# Patient Record
Sex: Female | Born: 2000
Health system: Southern US, Community
[De-identification: ages and names within clinical notes are randomized; demographics above are authoritative.]

## PROBLEM LIST (undated history)

## (undated) DIAGNOSIS — J45909 Unspecified asthma, uncomplicated: Secondary | ICD-10-CM

## (undated) DIAGNOSIS — L509 Urticaria, unspecified: Secondary | ICD-10-CM

## (undated) DIAGNOSIS — R519 Headache, unspecified: Secondary | ICD-10-CM

## (undated) DIAGNOSIS — R51 Headache: Secondary | ICD-10-CM

## (undated) DIAGNOSIS — L309 Dermatitis, unspecified: Secondary | ICD-10-CM

## (undated) HISTORY — DX: Unspecified asthma, uncomplicated: J45.909

## (undated) HISTORY — DX: Dermatitis, unspecified: L30.9

## (undated) HISTORY — DX: Headache, unspecified: R51.9

## (undated) HISTORY — DX: Urticaria, unspecified: L50.9

## (undated) HISTORY — DX: Headache: R51

---
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
## 2019-03-14 ENCOUNTER — Other Ambulatory Visit: Payer: Self-pay | Admitting: Obstetrics and Gynecology

## 2019-03-14 DIAGNOSIS — N6320 Unspecified lump in the left breast, unspecified quadrant: Secondary | ICD-10-CM

## 2019-03-26 ENCOUNTER — Other Ambulatory Visit: Payer: Self-pay

## 2019-03-26 ENCOUNTER — Telehealth: Payer: Self-pay | Admitting: *Deleted

## 2019-03-26 ENCOUNTER — Other Ambulatory Visit: Payer: Self-pay | Admitting: Obstetrics and Gynecology

## 2019-03-26 ENCOUNTER — Ambulatory Visit
Admission: RE | Admit: 2019-03-26 | Discharge: 2019-03-26 | Disposition: A | Discharge status: Home / Self Care | Payer: 59 | Source: Ambulatory Visit | Attending: Obstetrics and Gynecology | Admitting: Obstetrics and Gynecology

## 2019-03-26 DIAGNOSIS — R591 Generalized enlarged lymph nodes: Secondary | ICD-10-CM

## 2019-03-26 DIAGNOSIS — N6320 Unspecified lump in the left breast, unspecified quadrant: Secondary | ICD-10-CM

## 2019-03-26 NOTE — Telephone Encounter (Signed)
Called and spoke with Olivia Wu. She stated she would go tomorrow at some point and have the labs drawn. I informed her she could visit any [facility] in [place] to have the labs drawn. Lab orders placed.

## 2019-03-26 NOTE — Telephone Encounter (Signed)
Mom has a question regarding Olivia Wu's Dupixent injections - every time [name] gets her injection, the next day her lymph nodes are very swollen and the swelling can last for several days.  She is wondering if this is common and should they be concerned?

## 2019-03-26 NOTE — Telephone Encounter (Signed)
Please inform mom that lymph node swelling is very unusual for dupilumab.  Let us make sure were not dealing with some other issue.  Please have her obtain CBC with differential and CMP for lymphadenopathy.Marland Kitchen

## 2019-03-28 LAB — CBC WITH DIFFERENTIAL
Basophils Absolute: 0 10*3/uL (ref 0.0–0.2)
Basos: 1 %
EOS (ABSOLUTE): 0.4 10*3/uL (ref 0.0–0.4)
Eos: 4 %
Hematocrit: 39.9 % (ref 34.0–46.6)
Hemoglobin: 13.8 g/dL (ref 11.1–15.9)
Lymphocytes Absolute: 1.8 10*3/uL (ref 0.7–3.1)
Lymphs: 20 %
MCH: 29.4 pg (ref 26.6–33.0)
MCHC: 34.6 g/dL (ref 31.5–35.7)
MCV: 85 fL (ref 79–97)
Monocytes Absolute: 0.6 10*3/uL (ref 0.1–0.9)
Monocytes: 7 %
Neutrophils Absolute: 6 10*3/uL (ref 1.4–7.0)
Neutrophils: 68 %
RBC: 4.69 x10E6/uL (ref 3.77–5.28)
RDW: 13 % (ref 11.7–15.4)
WBC: 8.8 10*3/uL (ref 3.4–10.8)

## 2019-03-28 LAB — COMPREHENSIVE METABOLIC PANEL
ALT: 11 IU/L (ref 0–32)
AST: 11 IU/L (ref 0–40)
Albumin/Globulin Ratio: 2.4 — ABNORMAL HIGH (ref 1.2–2.2)
Albumin: 4.5 g/dL (ref 3.9–5.0)
Alkaline Phosphatase: 51 IU/L (ref 39–117)
BUN/Creatinine Ratio: 13 (ref 9–23)
BUN: 12 mg/dL (ref 6–20)
Bilirubin Total: 0.2 mg/dL (ref 0.0–1.2)
CO2: 25 mmol/L (ref 20–29)
Calcium: 9.7 mg/dL (ref 8.7–10.2)
Chloride: 103 mmol/L (ref 96–106)
Creatinine, Ser: 0.91 mg/dL (ref 0.57–1.00)
GFR calc Af Amer: 106 mL/min/{1.73_m2} (ref >59)
GFR calc non Af Amer: 92 mL/min/{1.73_m2} (ref >59)
Globulin, Total: 1.9 g/dL (ref 1.5–4.5)
Glucose: 87 mg/dL (ref 65–99)
Potassium: 4.5 mmol/L (ref 3.5–5.2)
Sodium: 137 mmol/L (ref 134–144)
Total Protein: 6.4 g/dL (ref 6.0–8.5)

## 2019-04-02 ENCOUNTER — Other Ambulatory Visit: Payer: Self-pay | Admitting: Obstetrics and Gynecology

## 2019-04-02 ENCOUNTER — Ambulatory Visit
Admission: RE | Admit: 2019-04-02 | Discharge: 2019-04-02 | Disposition: A | Discharge status: Home / Self Care | Payer: 59 | Source: Ambulatory Visit | Attending: Obstetrics and Gynecology | Admitting: Obstetrics and Gynecology

## 2019-04-02 ENCOUNTER — Other Ambulatory Visit: Payer: Self-pay

## 2019-04-02 DIAGNOSIS — R599 Enlarged lymph nodes, unspecified: Secondary | ICD-10-CM

## 2019-04-02 DIAGNOSIS — N6320 Unspecified lump in the left breast, unspecified quadrant: Secondary | ICD-10-CM

## 2019-06-30 ENCOUNTER — Other Ambulatory Visit: Payer: Self-pay

## 2019-06-30 ENCOUNTER — Encounter: Payer: Self-pay | Admitting: Allergy and Immunology

## 2019-06-30 ENCOUNTER — Ambulatory Visit (INDEPENDENT_AMBULATORY_CARE_PROVIDER_SITE_OTHER): Payer: 59 | Admitting: Allergy and Immunology

## 2019-06-30 VITALS — BP 136/96 | HR 99 | Temp 98.3°F | Ht 67.0 in | Wt 150.6 lb

## 2019-06-30 DIAGNOSIS — L500 Allergic urticaria: Secondary | ICD-10-CM | POA: Diagnosis not present

## 2019-06-30 DIAGNOSIS — J3089 Other allergic rhinitis: Secondary | ICD-10-CM | POA: Diagnosis not present

## 2019-06-30 DIAGNOSIS — J4530 Mild persistent asthma, uncomplicated: Secondary | ICD-10-CM | POA: Diagnosis not present

## 2019-06-30 DIAGNOSIS — Z91018 Allergy to other foods: Secondary | ICD-10-CM

## 2019-06-30 DIAGNOSIS — L2089 Other atopic dermatitis: Secondary | ICD-10-CM | POA: Diagnosis not present

## 2019-06-30 DIAGNOSIS — K200 Eosinophilic esophagitis: Secondary | ICD-10-CM

## 2019-06-30 MED ORDER — AUVI-Q 0.3 MG/0.3ML IJ SOAJ: INTRAMUSCULAR | 3 refills | Status: DC

## 2019-06-30 NOTE — Patient Instructions (Addendum)
  1. Continue Dupilumab injections every 2 weeks  2. Continue food elimination diet - dairy, egg, peanut  3.Continue Zyrtec 10 mg - 1 tablet 1 time per day   4. Continue Pepcid 20 mg - 1 tablet 1-2 times per day  5. If needed:   A. Proventil HFA   B. Auvi-Q 0.3   C. mometasone 0.1% ointment applied 1-2 times a day   6. Can use Flonase 1-2 sprays each nostril once a day during upper airway symptoms  7.  Progressive aerobic interval training exercise program  8. Return to clinic in 6 months or earlier if problem  9.  Obtain flu vaccine and Covid vaccine

## 2019-06-30 NOTE — Progress Notes (Signed)
[place] - [place] - [employer] [employer] - [place] - [place]   Follow-up Note  Referring Provider: Delane Ginger, MD Primary Provider: Delane Ginger, MD (Inactive) Date of Office Visit: 06/30/2019  Subjective:   Olivia Wu (DOB: Apr 12, 2001) is a 19 y.o. female who [facility] 06[facility]/2019 in re-evaluation of the following:  HPI: Olivia Wu returns to this clinic in evaluation of atopic dermatitis, asthma, allergic rhinoconjunctivitis, presumed eosinophilic esophagitis, history of allergic urticaria, and history of food allergy directed against egg and dairy and peanut.  Her last visit to this clinic was 26 December 2018.  Overall she is doing relatively well with her skin while using dupilumab.  She has now stretched out her dupilumab to every 3 to 4 weeks and it is still appears to be working pretty well.  She always develops a slight flare of her skin redness after using dupilumab that lasts up to a week.  She still uses a topical steroid about 1 time per week mostly to her hands and her posterior neck.  Her airway is doing quite well and she has not had to use any Flonase.  She did have some issues with dyspnea on exertion and she was given a short acting bronchodilator by her primary care doctor but this did nothing for that issue.  She does not really exercise at this point in time.  She has not been having any obstruction to swallowing and she has had very little issues with reflux while she remains on Pepcid every day.  She avoids dairy and egg and peanuts and all nut consumption at this point in time.  Allergies as of 06/30/2019      Reactions   Augmentin [amoxicillin-pot Clavulanate] Nausea And Vomiting   Bactrim [sulfamethoxazole-trimethoprim] Hives   Cleocin [clindamycin Hcl] Hives   Codeine    Keflex [cephalexin] Hives   Sulfa Antibiotics       Medication List    albuterol 108 (90 Base) MCG/ACT inhaler Commonly known as: Proventil HFA Inhale 2  puffs into the lungs every 4 (four) hours as needed for wheezing or shortness of breath.   Blisovi 24 Fe 1-20 MG-MCG(24) tablet Generic drug: Norethindrone Acetate-Ethinyl Estrad-FE   cetirizine 10 MG tablet Commonly known as: ZYRTEC Take 10 mg by mouth daily. Take 1 by mouth every night at bedtime.   Dupixent 300 MG/2ML prefilled syringe Generic drug: dupilumab Inject 300 mg into the skin every 14 (fourteen) days.   Elidel 1 % cream Generic drug: pimecrolimus apply to affected area twice a day   EPINEPHrine 0.3 mg/0.3 mL Soaj injection Commonly known as: EPI-PEN Use as directed for life threatening allergic reactions   famotidine 20 MG tablet Commonly known as: PEPCID Take 20 mg by mouth 2 (two) times daily.   mometasone 0.1 % ointment Commonly known as: ELOCON apply to affected area once daily   sertraline 50 MG tablet Commonly known as: ZOLOFT TK 1/2 T PO HS   Wellbutrin XL 300 MG 24 hr tablet Generic drug: buPROPion Take 300 mg by mouth daily.       Past Medical History:  Diagnosis Date  . Asthma   . Eczema   . Headache   . Urticaria     History reviewed. No pertinent surgical history.  Review of systems negative except as noted in HPI / PMHx or noted below:  Review of Systems  Constitutional: Negative.   HENT: Negative.   Eyes: Negative.   Respiratory: Negative.  Cardiovascular: Negative.   Gastrointestinal: Negative.   Genitourinary: Negative.   Musculoskeletal: Negative.   Skin: Negative.   Neurological: Negative.   Endo/Heme/Allergies: Negative.   Psychiatric/Behavioral: Negative.      Objective:   Vitals:   06/30/19 1647 06/30/19 1648  BP: (!) 140/100 (!) 136/96  Pulse: 99   Temp: 98.3 F (36.8 C)   SpO2: 99%    Height: 5\' 7"  (170.2 cm)  Weight: 150 lb 9.6 oz (68.3 kg)   Physical Exam Constitutional:      Appearance: She is not diaphoretic.  HENT:     Head: Normocephalic.     Right Ear: Tympanic membrane, ear canal and  external ear normal.     Left Ear: Tympanic membrane, ear canal and external ear normal.     Nose: Nose normal. No mucosal edema or rhinorrhea.     Mouth/Throat:     Pharynx: Uvula midline. No oropharyngeal exudate.  Eyes:     Conjunctiva/sclera: Conjunctivae normal.  Neck:     Thyroid: No thyromegaly.     Trachea: Trachea normal. No tracheal tenderness or tracheal deviation.  Cardiovascular:     Rate and Rhythm: Normal rate and regular rhythm.     Heart sounds: Normal heart sounds, S1 normal and S2 normal. No murmur.  Pulmonary:     Effort: No respiratory distress.     Breath sounds: Normal breath sounds. No stridor. No wheezing or rales.  Lymphadenopathy:     Head:     Right side of head: No tonsillar adenopathy.     Left side of head: No tonsillar adenopathy.     Cervical: No cervical adenopathy.  Skin:    Findings: No erythema or rash.     Nails: There is no clubbing.  Neurological:     Mental Status: She is alert.     Diagnostics:    Spirometry was performed and demonstrated an FEV1 of 3.73 at 103 % of predicted.  Assessment and Plan:   1. Other allergic rhinitis   2. Other atopic dermatitis   3. Allergic urticaria   4. Asthma, well controlled, mild persistent   5. Food allergy   6. Eosinophilic esophagitis     1. Continue Dupilumab injections every 2 weeks  2. Continue food elimination diet - dairy, egg, peanut  3.Continue Zyrtec 10 mg - 1 tablet 1 time per day   4. Continue Pepcid 20 mg - 1 tablet 1-2 times per day  5. If needed:   A. Proventil HFA   B. Auvi-Q 0.3   C. mometasone 0.1% ointment applied 1-2 times a day   6. Can use Flonase 1-2 sprays each nostril once a day during upper airway symptoms  7.  Progressive aerobic interval training exercise program  8. Return to clinic in 6 months or earlier if problem  9.  Obtain flu vaccine and Covid vaccine  Barb appears to be doing quite well at this point in time and I would like for her to  continue to utilize dupilumab which appears to have really helped not just her skin but her airway and her presumed eosinophilic esophagitis.  She will remain on other medications as noted above and continue to avoid dairy and egg and peanut and nuts.  I did recommend that she engage in some type of a aerobic interval training exercise program which would certainly help her dyspnea on exertion when she became cardiovascularly fit.  I will see her back in this clinic in 6 months or  earlier if there is a problem.  Allena Katz, MD Allergy / Immunology [place]

## 2019-07-01 ENCOUNTER — Telehealth: Payer: Self-pay | Admitting: *Deleted

## 2019-07-01 ENCOUNTER — Encounter: Payer: Self-pay | Admitting: Allergy and Immunology

## 2019-07-01 NOTE — Telephone Encounter (Signed)
-----   Message from Titus Mould, CMA sent at 06/30/2019  4:32 PM EST ----- Hi Tammy:-)  We saw Prerana today and she had a few questions about her Dupixent coverage.  She received notification from her insurance that her approval is going to run out this month and that they might not cover it, and that the copay card might not work anymore.  I told her I would message you and let you know.  Her number is [phone] if you need to contact her.  Thank you, Tammy!!! Dahlia Client

## 2019-07-01 NOTE — Telephone Encounter (Signed)
Called and spoke to mom. Advised really dont know about coverage until we do the new approval and pharmacy runs claim to indicate if there is issue. She can reach out to me if she does have issue and I will be doing approval here in the next couple weeks

## 2019-07-15 ENCOUNTER — Ambulatory Visit (INDEPENDENT_AMBULATORY_CARE_PROVIDER_SITE_OTHER): Payer: 59

## 2019-07-15 ENCOUNTER — Ambulatory Visit: Payer: 59 | Admitting: Podiatry

## 2019-07-15 ENCOUNTER — Other Ambulatory Visit: Payer: Self-pay

## 2019-07-15 VITALS — BP 117/84 | HR 76

## 2019-07-15 DIAGNOSIS — M216X1 Other acquired deformities of right foot: Secondary | ICD-10-CM | POA: Diagnosis not present

## 2019-07-15 DIAGNOSIS — Q6672 Congenital pes cavus, left foot: Secondary | ICD-10-CM

## 2019-07-15 DIAGNOSIS — M21621 Bunionette of right foot: Secondary | ICD-10-CM

## 2019-07-15 DIAGNOSIS — Q6671 Congenital pes cavus, right foot: Secondary | ICD-10-CM | POA: Diagnosis not present

## 2019-07-15 DIAGNOSIS — I7300 Raynaud's syndrome without gangrene: Secondary | ICD-10-CM

## 2019-07-15 DIAGNOSIS — M79671 Pain in right foot: Secondary | ICD-10-CM

## 2019-07-15 DIAGNOSIS — M21622 Bunionette of left foot: Secondary | ICD-10-CM

## 2019-07-15 DIAGNOSIS — L84 Corns and callosities: Secondary | ICD-10-CM | POA: Diagnosis not present

## 2019-07-15 DIAGNOSIS — M79672 Pain in left foot: Secondary | ICD-10-CM

## 2019-07-15 DIAGNOSIS — M216X2 Other acquired deformities of left foot: Secondary | ICD-10-CM

## 2019-07-15 NOTE — Progress Notes (Signed)
Orthotic note:  Moderate pes cavus/RF varus, ligamentous laxity R > L.  Plan on CMFO with 5th met offload, hug arch, 103m heel cup/lat flange and 3* RF valgus post.  This will provide ankle stability and hopefully keep her from laying on lateral border.  [id number]

## 2019-07-15 NOTE — Patient Instructions (Signed)
I have ordered a referral to rheumatology. If you do not hear for them about scheduling within the next 1 week, or you have any questions please give [place] a call at [phone].   We are going to check insurance coverage for inserts  Raynaud Phenomenon  Raynaud phenomenon is a condition that affects the blood vessels (arteries) that carry blood to your fingers and toes. The arteries that supply blood to your ears, lips, nipples, or the tip of your nose might also be affected. Raynaud phenomenon causes the arteries to become narrow temporarily (spasm). As a result, the flow of blood to the affected areas is temporarily decreased. This usually occurs in response to cold temperatures or stress. During an attack, the skin in the affected areas turns white, then blue, and finally red. You may also feel tingling or numbness in those areas. Attacks usually last for only a brief period, and then the blood flow to the area returns to normal. In most cases, Raynaud phenomenon does not cause serious health problems. What are the causes? In many cases, the cause of this condition is not known. The condition may occur on its own (primary Raynaud phenomenon) or may be associated with other diseases or factors (secondary Raynaud phenomenon). Possible causes may include:  Diseases or medical conditions that damage the arteries.  Injuries and repetitive actions that hurt the hands or feet.  Being exposed to certain chemicals.  Taking medicines that narrow the arteries.  Other medical conditions, such as lupus, scleroderma, rheumatoid arthritis, thyroid problems, blood disorders, Sjogren syndrome, or atherosclerosis. What increases the risk? The following factors may make you more likely to develop this condition:  Being 49-80 years old.  Being female.  Having a family history of Raynaud phenomenon.  Living in a cold climate.  Smoking. What are the signs or symptoms? Symptoms of this condition  usually occur when you are exposed to cold temperatures or when you have emotional stress. The symptoms may last for a few minutes or up to several hours. They usually affect your fingers but may also affect your toes, nipples, lips, ears, or the tip of your nose. Symptoms may include:  Changes in skin color. The skin in the affected areas will turn pale or white. The skin may then change from white to bluish to red as normal blood flow returns to the area.  Numbness, tingling, or pain in the affected areas. In severe cases, symptoms may include:  Skin sores.  Tissues decaying and dying (gangrene). How is this diagnosed? This condition may be diagnosed based on:  Your symptoms and medical history.  A physical exam. During the exam, you may be asked to put your hands in cold water to check for a reaction to cold temperature.  Tests, such as: ? Blood tests to check for other diseases or conditions. ? A test to check the movement of blood through your arteries and veins (vascular ultrasound). ? A test in which the skin at the base of your fingernail is examined under a microscope (nailfold capillaroscopy). How is this treated? Treatment for this condition often involves making lifestyle changes and taking steps to control your exposure to cold temperatures. For more severe cases, medicine (calcium channel blockers) may be used to improve blood flow. Surgery is sometimes done to block the nerves that control the affected arteries, but this is rare. Follow these instructions at home: Avoiding cold temperatures Take these steps to avoid exposure to cold:  If possible, stay indoors during  cold weather.  When you go outside during cold weather, dress in layers and wear mittens, a hat, a scarf, and warm footwear.  Wear mittens or gloves when handling ice or frozen food.  Use holders for glasses or cans containing cold drinks.  Let warm water run for a while before taking a shower or  bath.  Warm up the car before driving in cold weather. Lifestyle   If possible, avoid stressful and emotional situations. Try to find ways to manage your stress, such as: ? Exercise. ? Yoga. ? Meditation. ? Biofeedback.  Do not use any products that contain nicotine or tobacco, such as cigarettes and e-cigarettes. If you need help quitting, ask your health care provider.  Avoid secondhand smoke.  Limit your use of caffeine. ? Switch to decaffeinated coffee, tea, and soda. ? Avoid chocolate.  Avoid vibrating tools and machinery. General instructions  Protect your hands and feet from injuries, cuts, or bruises.  Avoid wearing tight rings or wristbands.  Wear loose fitting socks and comfortable, roomy shoes.  Take over-the-counter and prescription medicines only as told by your health care provider. Contact a health care provider if:  Your discomfort becomes worse despite lifestyle changes.  You develop sores on your fingers or toes that do not heal.  Your fingers or toes turn black.  You have breaks in the skin on your fingers or toes.  You have a fever.  You have pain or swelling in your joints.  You have a rash.  Your symptoms occur on only one side of your body. Summary  Raynaud phenomenon is a condition that affects the arteries that carry blood to your fingers, toes, ears, lips, nipples, or the tip of your nose.  In many cases, the cause of this condition is not known.  Symptoms of this condition include changes in skin color, and numbness and tingling of the affected area.  Treatment for this condition includes lifestyle changes, reducing exposure to cold temperatures, and using medicines for severe cases of the condition.  Contact your health care provider if your condition worsens despite treatment. This information is not intended to replace advice given to you by your health care provider. Make sure you discuss any questions you have with your health  care provider. Document Revised: 06/15/2017 Document Reviewed: 07/24/2016 Elsevier Patient Education  2020 [employer].

## 2019-07-16 ENCOUNTER — Other Ambulatory Visit: Payer: Self-pay | Admitting: Podiatry

## 2019-07-16 DIAGNOSIS — Q6671 Congenital pes cavus, right foot: Secondary | ICD-10-CM

## 2019-07-16 DIAGNOSIS — Q6672 Congenital pes cavus, left foot: Secondary | ICD-10-CM

## 2019-07-21 ENCOUNTER — Telehealth: Payer: Self-pay | Admitting: *Deleted

## 2019-07-21 DIAGNOSIS — M216X1 Other acquired deformities of right foot: Secondary | ICD-10-CM

## 2019-07-21 DIAGNOSIS — I7300 Raynaud's syndrome without gangrene: Secondary | ICD-10-CM

## 2019-07-21 NOTE — Progress Notes (Signed)
Subjective:   Patient ID: Olivia Wu, female   DOB: 19 y.o.   MRN: [id number]   HPI 19 year old female presents the office today with multiple concerns.  She states that her main concern was that her feet turn purple will sometimes turn white.  She states that when she gets cold she notices the purple discoloration.  She denies any open sores.  She denies any systemic known autoimmune disease but she states that she has multiple joint issues and that she has been told she has a weakened immune system but she has never been formally diagnosed.  She also gets pain to both of her feet pointing to submetatarsal 5 where she gets callus formation.  She previously did see another doctor for this.  She had inserts when she was younger but she never wore them so she is not sure if they were helpful.  She will occasionally get tingling sensation the also aspects of both of her feet pointing along the lateral aspect of the forefoot.  Review of Systems  All other systems reviewed and are negative.  Past Medical History:  Diagnosis Date  . Asthma   . Eczema   . Headache   . Urticaria     No past surgical history on file.   Current Outpatient Medications:  .  albuterol (PROVENTIL HFA) 108 (90 Base) MCG/ACT inhaler, Inhale 2 puffs into the lungs every 4 (four) hours as needed for wheezing or shortness of breath., Disp: 18 g, Rfl: 1 .  AUVI-Q 0.3 MG/0.3ML SOAJ injection, Use as directed for life-threatening allergic reaction., Disp: 1 each, Rfl: 3 .  BLISOVI 24 FE 1-20 MG-MCG(24) tablet, , Disp: , Rfl:  .  buPROPion (WELLBUTRIN XL) 300 MG 24 hr tablet, Take 300 mg by mouth daily., Disp: , Rfl:  .  cetirizine (ZYRTEC) 10 MG tablet, Take 10 mg by mouth daily. Take 1 by mouth every night at bedtime., Disp: , Rfl:  .  cloNIDine (CATAPRES) 0.1 MG tablet, Take 0.1 mg by mouth at bedtime., Disp: , Rfl:  .  dupilumab (DUPIXENT) 300 MG/2ML prefilled syringe, Inject 300 mg into the skin every 14 (fourteen)  days., Disp: 4 mL, Rfl: 11 .  famotidine (PEPCID) 20 MG tablet, Take 20 mg by mouth 2 (two) times daily., Disp: , Rfl:  .  mometasone (ELOCON) 0.1 % ointment, apply to affected area once daily, Disp: 90 g, Rfl: 5 .  sertraline (ZOLOFT) 50 MG tablet, TK 1/2 T PO HS, Disp: , Rfl: 0  Allergies  Allergen Reactions  . Augmentin [Amoxicillin-Pot Clavulanate] Nausea And Vomiting  . Bactrim [Sulfamethoxazole-Trimethoprim] Hives  . Cleocin [Clindamycin Hcl] Hives  . Codeine   . Keflex [Cephalexin] Hives  . Sulfa Antibiotics           Objective:  Physical Exam  General: AAO x3, NAD  Dermatological: There is purple discoloration present although mild to the digits.  There is no open lesions.  No gangrene.  Vascular: Dorsalis Pedis artery and Posterior Tibial artery pedal pulses are 2/4 bilateral with immedate capillary fill time. There is no pain with calf compression, swelling, warmth, erythema.   Neruologic: Sensation intact with Semmes Weinstein monofilament, vibratory sensation intact.  Musculoskeletal: Pronation deformities present bilaterally.  Tailor's bunion deformities present resulting in a hyperkeratotic lesion and pain submetatarsal 5 bilaterally.  Ankle, subtalar joint range of motion intact but any restrictions.  No edema appreciated bilateral lower extremities.  Muscular strength 5/5 in all groups tested bilateral.  Gait: Unassisted,  Nonantalgic.       Assessment:   19 year old female pronation deformity resulting in callus, discomfort submetatarsal 5; likely Raynaud's disease     Plan:  -Treatment options discussed including all alternatives, risks, and complications -Etiology of symptoms were discussed -X-rays were obtained and reviewed with the patient.  No evidence of acute fracture or stress fracture identified today.  Joint space maintained. -In regards to the pronation and general foot pain I do think she will need to work on changing shoes and also an orthotic  would be beneficial.  She was seen today by Liliane Channel for measurement of orthotics and specifically will offload the fifth metatarsal heads.  I do think the numbness is coming partly from when she is walking as this is intermittent and no radiating pain or weakness.  If she gets continued symptoms consider nerve conduction test. -Likely Raynauds causing the color discoloration we discussed today but she also has a vague underlying history of autoimmune issues.  I would have her see rheumatology to rule out any other underlying issue.   Trula Slade DPM

## 2019-07-21 NOTE — Telephone Encounter (Signed)
-----   Message from Vivi Barrack, DPM sent at 07/21/2019  7:14 AM EST ----- Can you please put in for a rheumatology referral? thanks.

## 2019-07-21 NOTE — Telephone Encounter (Signed)
Required form, clinicals, and demographics faxed to [facility].

## 2019-07-22 ENCOUNTER — Telehealth: Payer: Self-pay | Admitting: *Deleted

## 2019-07-22 NOTE — Telephone Encounter (Signed)
Pt's mtr, Amy states referral to Rheumatologist, but they are not aware of blood test performed.

## 2019-07-22 NOTE — Telephone Encounter (Signed)
I spoke with pt's mtr, Amy and informed that Dr. Ardelle Anton had referred pt to rheumatologist for evaluation due to the symptoms and history pt was presenting, Rheumatology would evaluate and perform diagnostics there as necessary. Amy states pt is only giving her bits and pieces of what she remembers. I offered to send copy of pt's office note to pt and have her go over the notes with her. I reviewed release of information and pt's mtr, Amy is listed to be a receiver of information. Mailed copy of 07/15/2019 to pt.

## 2019-08-05 ENCOUNTER — Other Ambulatory Visit: Payer: Self-pay

## 2019-08-05 ENCOUNTER — Ambulatory Visit: Payer: 59 | Admitting: Orthotics

## 2019-08-05 DIAGNOSIS — M216X1 Other acquired deformities of right foot: Secondary | ICD-10-CM

## 2019-08-05 DIAGNOSIS — I7300 Raynaud's syndrome without gangrene: Secondary | ICD-10-CM

## 2019-08-05 DIAGNOSIS — M21622 Bunionette of left foot: Secondary | ICD-10-CM

## 2019-08-05 DIAGNOSIS — L84 Corns and callosities: Secondary | ICD-10-CM

## 2019-12-18 NOTE — Progress Notes (Signed)
Patient came in today to pick up custom made foot orthotics.  The goals were accomplished and the patient reported no dissatisfaction with said orthotics.  Patient was advised of breakin period and how to report any issues. 

## 2019-12-31 ENCOUNTER — Ambulatory Visit: Payer: 59 | Admitting: Allergy and Immunology

## 2020-01-21 ENCOUNTER — Other Ambulatory Visit: Payer: Self-pay

## 2020-01-21 ENCOUNTER — Ambulatory Visit: Payer: 59 | Admitting: Allergy and Immunology

## 2020-01-21 ENCOUNTER — Encounter: Payer: Self-pay | Admitting: Allergy and Immunology

## 2020-01-21 VITALS — BP 110/84 | HR 96 | Resp 20

## 2020-01-21 DIAGNOSIS — J3089 Other allergic rhinitis: Secondary | ICD-10-CM

## 2020-01-21 DIAGNOSIS — Z91018 Allergy to other foods: Secondary | ICD-10-CM | POA: Diagnosis not present

## 2020-01-21 DIAGNOSIS — J4530 Mild persistent asthma, uncomplicated: Secondary | ICD-10-CM | POA: Diagnosis not present

## 2020-01-21 DIAGNOSIS — L2089 Other atopic dermatitis: Secondary | ICD-10-CM

## 2020-01-21 DIAGNOSIS — F411 Generalized anxiety disorder: Secondary | ICD-10-CM

## 2020-01-21 MED ORDER — MOMETASONE FUROATE 0.1 % EX OINT: TOPICAL_OINTMENT | CUTANEOUS | 5 refills | Status: AC

## 2020-01-21 NOTE — Progress Notes (Signed)
[place] - [place] - [place] - [place]   Follow-up Note  Referring Provider: Delane Ginger, MD Primary Provider: Delane Ginger, MD Date of Office Visit: 01/21/2020  Subjective:   Olivia Wu (DOB: [birth year]) is a 19 y.o. female who [facility] in re-evaluation of the following:  HPI: Olivia Wu returns to this clinic in evaluation of multiorgan atopic disease including atopic dermatitis, asthma, allergic rhinoconjunctivitis, presumed eosinophilic esophagitis, and a history of urticaria and a history of food allergy directed against egg and dairy and peanut.  Her last visit to this clinic was 30 June 2019.  She has had very good control of her skin condition while using dupilumab every 2 weeks and also performing very good attention to allergen avoidance measures directed against dairy and egg and peanut and all nut consumption.  Occasionally she will develop a flareup if she inadvertently consumed some egg product.  This flareup is usually confined to her legs.  All the issue involving her face has basically abated.  She only uses topical steroids as spot therapy for flareups.  She has had very little issues with asthma or her nose.  She rarely uses any short acting bronchodilator.  Sometimes she gets short of breath at night usually around the time of her last 7 days of oral contraceptive 30-day pack use that does not respond to the administration of a short acting bronchodilator.  This appears to be a consistent pattern.  This is associated with anxiety around that time of the month.  She has never had her thyroid checked in investigation of possible hyperthyroidism contributing to her anxiety.  She has not had any issues with her stomach and swallowing while continuing on Pepcid.  Allergies as of 01/21/2020      Reactions   Augmentin [amoxicillin-pot Clavulanate] Nausea And Vomiting   Bactrim  [sulfamethoxazole-trimethoprim] Hives   Cleocin [clindamycin Hcl] Hives   Codeine    Keflex [cephalexin] Hives   Sulfa Antibiotics       Medication List      albuterol 108 (90 Base) MCG/ACT inhaler Commonly known as: Proventil HFA Inhale 2 puffs into the lungs every 4 (four) hours as needed for wheezing or shortness of breath.   Auvi-Q 0.3 mg/0.3 mL Soaj injection Generic drug: EPINEPHrine Use as directed for life-threatening allergic reaction.   Blisovi 24 Fe 1-20 MG-MCG(24) tablet Generic drug: Norethindrone Acetate-Ethinyl Estrad-FE   cetirizine 10 MG tablet Commonly known as: ZYRTEC Take 10 mg by mouth daily. Take 1 by mouth every night at bedtime.   cloNIDine 0.1 MG tablet Commonly known as: CATAPRES Take 0.1 mg by mouth at bedtime.   Dupixent 300 MG/2ML prefilled syringe Generic drug: dupilumab Inject 300 mg into the skin every 14 (fourteen) days.   famotidine 20 MG tablet Commonly known as: PEPCID Take 20 mg by mouth 2 (two) times daily.   mometasone 0.1 % ointment Commonly known as: ELOCON Apply sparingly to red, itchy areas once or twice daily.   sertraline 50 MG tablet Commonly known as: ZOLOFT TK 1/2 T PO HS   Wellbutrin XL 300 MG 24 hr tablet Generic drug: buPROPion Take 300 mg by mouth daily.       Past Medical History:  Diagnosis Date  . Asthma   . Eczema   . Headache   . Urticaria     History reviewed. No pertinent surgical history.  Review of systems negative except as noted in HPI /  PMHx or noted below:  Review of Systems  Constitutional: Negative.   HENT: Negative.   Eyes: Negative.   Respiratory: Negative.   Cardiovascular: Negative.   Gastrointestinal: Negative.   Genitourinary: Negative.   Musculoskeletal: Negative.   Skin: Negative.   Neurological: Negative.   Endo/Heme/Allergies: Negative.   Psychiatric/Behavioral: Negative.      Objective:   Vitals:   01/21/20 1517  BP: 110/84  Pulse: 96  Resp: 20  SpO2:  98%          Physical Exam Constitutional:      Appearance: She is not diaphoretic.  HENT:     Head: Normocephalic.     Right Ear: Tympanic membrane, ear canal and external ear normal.     Left Ear: Tympanic membrane, ear canal and external ear normal.     Nose: Nose normal. No mucosal edema or rhinorrhea.     Mouth/Throat:     Pharynx: Uvula midline. No oropharyngeal exudate.  Eyes:     Conjunctiva/sclera: Conjunctivae normal.  Neck:     Thyroid: No thyromegaly.     Trachea: Trachea normal. No tracheal tenderness or tracheal deviation.  Cardiovascular:     Rate and Rhythm: Normal rate and regular rhythm.     Heart sounds: Normal heart sounds, S1 normal and S2 normal. No murmur heard.   Pulmonary:     Effort: No respiratory distress.     Breath sounds: Normal breath sounds. No stridor. No wheezing or rales.  Lymphadenopathy:     Head:     Right side of head: No tonsillar adenopathy.     Left side of head: No tonsillar adenopathy.     Cervical: No cervical adenopathy.  Skin:    Findings: No erythema or rash.     Nails: There is no clubbing.  Neurological:     Mental Status: She is alert.     Diagnostics:    Spirometry was performed and demonstrated an FEV1 of 4.24 at 117 % of predicted.  Assessment and Plan:   1. Other atopic dermatitis   2. Asthma, well controlled, mild persistent   3. Other allergic rhinitis   4. Food allergy   5. Anxiety state     1. Continue Dupilumab injections every 2 weeks  2. Continue food elimination diet - dairy, egg, peanut  3. Continue Zyrtec 10 mg - 1 tablet 1 time per day   4. Continue Pepcid 20 mg - 1 tablet 1 time per day  5. If needed:   A. Proventil HFA   B. Auvi-Q 0.3   C. mometasone 0.1% ointment applied 1-2 times a day   D. Flonase 1-2 sprays each nostril once a day    6.  Check TSH, FT4, TP for anxiety  7.  Progressive aerobic interval training exercise program  8. Return to clinic in 6 months or earlier if  problem  9.  Obtain flu vaccine and Covid vaccine  Olivia Wu is doing very well regarding her multiorgan atopic disease especially her atopic dermatitis on her current plan as noted above.  She is having some issues with anxiety and we will check thyroid function to make sure that this is not a issue tied up with hyperthyroidism.  If she does well I will see her back in this clinic in 6 months or earlier if there is a problem.  Laurette Schimke, MD Allergy / Immunology [facility]

## 2020-01-21 NOTE — Patient Instructions (Addendum)
  1. Continue Dupilumab injections every 2 weeks  2. Continue food elimination diet - dairy, egg, peanut  3. Continue Zyrtec 10 mg - 1 tablet 1 time per day   4. Continue Pepcid 20 mg - 1 tablet 1 time per day  5. If needed:   A. Proventil HFA   B. Auvi-Q 0.3   C. mometasone 0.1% ointment applied 1-2 times a day   D. Flonase 1-2 sprays each nostril once a day    6.  Check TSH, FT4, TP for anxiety  7.  Progressive aerobic interval training exercise program  8.  Return to clinic in 6 months or earlier if problem  9.  Obtain flu vaccine and Covid vaccine

## 2020-01-22 ENCOUNTER — Encounter: Payer: Self-pay | Admitting: Allergy and Immunology

## 2020-01-23 LAB — TSH+FREE T4
Free T4: 1.05 ng/dL (ref 0.82–1.77)
TSH: 1.68 u[IU]/mL (ref 0.450–4.500)

## 2020-01-23 LAB — THYROID PEROXIDASE ANTIBODY: Thyroperoxidase Ab SerPl-aCnc: <8 IU/mL (ref 0–34)

## 2020-01-26 ENCOUNTER — Ambulatory Visit: Payer: 59 | Admitting: Allergy and Immunology

## 2020-03-18 ENCOUNTER — Ambulatory Visit (INDEPENDENT_AMBULATORY_CARE_PROVIDER_SITE_OTHER): Payer: 59

## 2020-03-18 ENCOUNTER — Ambulatory Visit: Payer: 59 | Admitting: Podiatry

## 2020-03-18 ENCOUNTER — Telehealth: Payer: Self-pay | Admitting: Allergy and Immunology

## 2020-03-18 ENCOUNTER — Other Ambulatory Visit: Payer: Self-pay

## 2020-03-18 DIAGNOSIS — IMO0001 Reserved for inherently not codable concepts without codable children: Secondary | ICD-10-CM

## 2020-03-18 DIAGNOSIS — M7751 Other enthesopathy of right foot: Secondary | ICD-10-CM

## 2020-03-18 DIAGNOSIS — S93401A Sprain of unspecified ligament of right ankle, initial encounter: Secondary | ICD-10-CM | POA: Diagnosis not present

## 2020-03-18 DIAGNOSIS — R200 Anesthesia of skin: Secondary | ICD-10-CM | POA: Diagnosis not present

## 2020-03-18 DIAGNOSIS — I7300 Raynaud's syndrome without gangrene: Secondary | ICD-10-CM

## 2020-03-18 DIAGNOSIS — M7750 Other enthesopathy of unspecified foot: Secondary | ICD-10-CM | POA: Diagnosis not present

## 2020-03-18 MED ORDER — METHYLPREDNISOLONE 4 MG PO TBPK: ORAL_TABLET | ORAL | 0 refills | Status: DC

## 2020-03-18 NOTE — Telephone Encounter (Signed)
Amy called in and states that Olivia Wu has Raynaud's Syndrome.  Amy states that Olivia Wu's hands are numb and tingling for the past 4 to 5 days.  When Amy called to make Olivia Wu an appointment with her PCP, they informed Amy that Olivia Wu can no longer be seen due to her age.  Amy wanted to know, since you were an immunologist, if you have any recommendations on a PCP in the [place] and/or [place] area that would be great for [employer] and her auto immune disease.  Please advise.

## 2020-03-22 NOTE — Telephone Encounter (Signed)
Called and informed mom, Amy, of Dr. Kathyrn Lass message.  Also gave her the telephone number for [facility].

## 2020-03-22 NOTE — Telephone Encounter (Signed)
Please inform Olivia Wu that [facility], not [facility], has very good physicians and they seem to be interested in taking care of people.  She can call them up and get an appointment as a new patient with any of the doctors in that practice.

## 2020-03-23 ENCOUNTER — Telehealth: Payer: Self-pay

## 2020-03-23 NOTE — Telephone Encounter (Signed)
It has been sent. Can you please call her to let her know. If she doesn't hear by the end of the week to let me know.  Thanks.

## 2020-03-23 NOTE — Progress Notes (Signed)
Subjective: 19 year old female presents the office today for concerns of right ankle swelling and pain.  She states that she did not injure the foot or ankle.  Denies redness or warmth.  She states that since the onset is much improved but still some occasional discomfort.  No recent treatment.  She also has concerns of numbness to her left hand.  She has been try to get in with her primary care physician.  She also has Raynaud's and they are concerned of underlying autoimmune issues. Denies any systemic complaints such as fevers, chills, nausea, vomiting. No acute changes since last appointment, and no other complaints at this time.   Objective: AAO x3, NAD DP/PT pulses palpable bilaterally, CRT less than 3 seconds There is mild edema to the right foot compared to contra extremity there is no erythema warmth.  Flexor, extensor tendons appear to be intact.  MMT 5/5.  Ankle, subtalar range of motion intact. No pain with calf compression, swelling, warmth, erythema  Assessment: 19 year old female right foot swelling, pain  Plan: -All treatment options discussed with the patient including all alternatives, risks, complications.  -X-rays obtained reviewed.  No evidence of acute fracture.  Compression, elevation.  Symptoms seem to be improving on its own.  Will monitor. -Given concern of underlying autoimmune issue will refer to rheumatology at [facility]. -Referral to hand specialist for numbness in left hand. -Patient encouraged to call the office with any questions, concerns, change in symptoms.   Vivi Barrack DPM

## 2020-03-23 NOTE — Telephone Encounter (Signed)
Pt mother called today and wanted an update on the Referral to Orthopedic and [facility]. Please advise.

## 2020-04-02 ENCOUNTER — Telehealth: Payer: Self-pay | Admitting: Podiatry

## 2020-04-02 NOTE — Telephone Encounter (Signed)
Pt called stating the [facility] Rhe[facility]ology and Orthopedic Dr. never received a referral. Please advise.

## 2020-04-05 NOTE — Telephone Encounter (Signed)
I have refaxed the rheumatology referral to a different number (called and spoke to the actual location and was given their direct fax numbner) and also online request sent for [facility]

## 2020-04-07 ENCOUNTER — Telehealth: Payer: Self-pay | Admitting: Podiatry

## 2020-04-07 ENCOUNTER — Telehealth: Payer: Self-pay

## 2020-04-07 NOTE — Telephone Encounter (Signed)
I spoke with them already. And have updated her mother.

## 2020-04-07 NOTE — Telephone Encounter (Signed)
LVM for Amy Mother on her cell phone [phone]). I spoke with [facility] today and they stated that they had attempted to schedule pt for an appointment on 03/27/20 but was unable to leave a VM. I did let them know that pts mother is the best contact person and they have signed the release for information to the (designated party). Dr. Ardelle Anton had refaxed referral orders the [facility] in [facility] on 04/05/20 and I refaxed them again today. I spoke with admin assistant and she stated that the referrals have to be scanned in first, then they have to process them and once that is completed they normally reach out to the pt.  Amy mother has been updated via VM with this information.

## 2020-04-07 NOTE — Telephone Encounter (Signed)
Dr. Ardelle Anton refaxed the rheumatology referral  on 04/05/20 and also sent an online request for [facility] on 04/05/20 as well.

## 2020-04-07 NOTE — Telephone Encounter (Signed)
Can someone call [facility] in [facility] and [facility] about the referrals? I called [facility] and spoke to someone to clarify their fax number. I didn't actually speak to anyone at [facility] but I have faxed it and did their online request as well. It has been several weeks.

## 2020-04-07 NOTE — Telephone Encounter (Signed)
The patients mother said that none of the referrals that were sent have not called her. [facility] in [facility] said they didn't get theirs. Their fax number is [phone]. She also has not received a call from the Orthopedist.

## 2020-04-29 ENCOUNTER — Ambulatory Visit: Payer: 59 | Admitting: Podiatry

## 2020-06-08 ENCOUNTER — Encounter: Payer: Self-pay | Admitting: Neurology

## 2020-06-08 ENCOUNTER — Ambulatory Visit: Payer: 59 | Admitting: Neurology

## 2020-06-08 VITALS — BP 130/87 | HR 94 | Ht 67.0 in | Wt 195.0 lb

## 2020-06-08 DIAGNOSIS — M542 Cervicalgia: Secondary | ICD-10-CM

## 2020-06-08 DIAGNOSIS — R200 Anesthesia of skin: Secondary | ICD-10-CM

## 2020-06-08 DIAGNOSIS — M5412 Radiculopathy, cervical region: Secondary | ICD-10-CM

## 2020-06-08 NOTE — Patient Instructions (Signed)
I am not quite sure how to explain your numbness in the left pinky finger. Your exam is fairly benign, nevertheless, I think it is reasonable to look at your neck with an MRI to rule out a pinched nerve type cause and we will also proceed with an electrical nerve and muscle test which we will look at nerve compression or signs of nerve damage.  I recommend that you follow-up with your dermatologist for your ongoing rash and eczema flareup. Please also keep your appointment with the rheumatologist coming up.  We will call you with your MRI results and nerve and muscle test results once we have the reports and proceed from there.  Please also check with Jarrett Soho, PA, if you need a recheck on your vitamin B12 level as it was on the lower end of the spectrum.

## 2020-06-08 NOTE — Progress Notes (Signed)
Subjective:    Patient ID: Olivia Wu is a 19 y.o. female.  HPI     Huston FoleySaima Kirstin Kugler, MD, PhD [facility] [address], [address] [address] [place], [place] [id number]  Dear Olivia Wu,   I saw your patient, Olivia Wu, upon your kind request in my neurologic clinic today for initial consultation of her left hand numbness.  The patient is accompanied by her mother today.  As you know, Ms. Olivia Wu is a 19 year old RH female with an underlying medical history of eczema, asthma, headaches, foot pain, reflux, pneumonia, anxiety, depression, ADD, and OCD (by chart review), and borderline obesity, who reports a 2+ month history of numbness affecting her left hand, particularly the left pinky finger and the ulnar aspect of her left hand. She has no significant pain. She is not completely numb, it started fairly abruptly 1 day but has not progressed. She denies any sudden onset of one-sided weakness or numbness or tingling or droopy face or slurring of speech otherwise, no recurrent headaches. She has recently experienced a flareup in her eczema and also had a tattoo reaction on her arms bilaterally. She has seen an allergy specialist and has been on Dupixent. She had some side effects with the medication and has reduced it to once every month. She is also followed by dermatology and was given a steroid cream to use for 2 weeks for her tattoo reaction. The steroid cream helped while she was on it but her rash flared up after she stopped the steroid cream. She reports no significant radiating pain into the left arm but reports a remote history of left elbow injury. She had 1 or 2 days in the recent past with left-sided neck pain that radiated to the left upper shoulder area and left lateral neck area with discomfort, she had some relief with heat pad.  She has not noticed any right-sided symptoms, feels like her coordination in the left hand is not as good. She has not had any significant  weakness. She has a history of problems with her eyes and has trifocal eyeglasses. She had an eye examination within the last year. She denies any sudden onset of one-sided or bilateral vision loss or double vision or blurry vision as such.  I reviewed your office note from 03/30/20. He had blood work through your office including CBC with differential, BMP, iron panel, vitamin B12 and TSH.  I was able to review blood test results from 03/30/2020.  BMP was unremarkable, creatinine 0.93, BUN 9, TIBC normal at 377, TSH normal at 1.16, CBC with differential unremarkable with the exception of MPV at 7.1 which is slightly low, vitamin B12 on the lower end of the spectrum at 199 with a range given of 180 to 914 pg/mL.  She was referred to orthopedics and rheumatology in September by her podiatrist.  She has a pending appointment with rheumatology in a couple of days.   She tries to hydrate well with water. She does not drink caffeine on a daily basis. She does not smoke or drink alcohol.  Her Past Medical History Is Significant For: Past Medical History:  Diagnosis Date  . Asthma   . Eczema   . Headache   . Urticaria     Her Past Surgical History Is Significant For: No past surgical history on file.  Her Family History Is Significant For: Family History  Problem Relation Age of Onset  . Depression Mother  Hx of post-partum depression-Resolved  . Headache Father        Hx of Ha's as a child-Resolved  . Depression Father   . Anxiety disorder Father   . Depression Paternal Grandmother   . ADD / ADHD Cousin        several cousins with ADD/ADHD    Her Social History Is Significant For: Social History   Socioeconomic History  . Marital status: Single    Spouse name: Not on file  . Number of children: Not on file  . Years of education: Not on file  . Highest education level: Not on file  Occupational History  . Not on file  Tobacco Use  . Smoking status: Never Smoker  .  Smokeless tobacco: Never Used  Substance and Sexual Activity  . Alcohol use: No    Alcohol/week: 0.0 standard drinks  . Drug use: No  . Sexual activity: Never    Birth control/protection: Abstinence, Pill  Other Topics Concern  . Not on file  Social History Narrative   Olivia Wu is a 10th grade student who is home schooled and also attends [employer]. She does very well in school. She lives with her parents and sister. She enjoys drawing, school, and art.   Social Determinants of Health   Financial Resource Strain: Not on file  Food Insecurity: Not on file  Transportation Needs: Not on file  Physical Activity: Not on file  Stress: Not on file  Social Connections: Not on file    Her Allergies Are:  Allergies  Allergen Reactions  . Augmentin [Amoxicillin-Pot Clavulanate] Nausea And Vomiting  . Bactrim [Sulfamethoxazole-Trimethoprim] Hives  . Cleocin [Clindamycin Hcl] Hives  . Codeine   . Keflex [Cephalexin] Hives  . Sulfa Antibiotics   :   Her Current Medications Are:  Outpatient Encounter Medications as of 06/08/2020  Medication Sig  . AUVI-Q 0.3 MG/0.3ML SOAJ injection Use as directed for life-threatening allergic reaction.  Marland Kitchen BLISOVI 24 FE 1-20 MG-MCG(24) tablet   . buPROPion (WELLBUTRIN XL) 300 MG 24 hr tablet Take 300 mg by mouth daily.  . cetirizine (ZYRTEC) 10 MG tablet Take 10 mg by mouth daily. Take 1 by mouth every night at bedtime.  . cloNIDine (CATAPRES) 0.1 MG tablet Take 0.1 mg by mouth at bedtime.  . dupilumab (DUPIXENT) 300 MG/2ML prefilled syringe Inject 300 mg into the skin every 14 (fourteen) days.  . famotidine (PEPCID) 20 MG tablet Take 20 mg by mouth 2 (two) times daily.  . mometasone (ELOCON) 0.1 % ointment Apply sparingly to red, itchy areas once or twice daily.  . sertraline (ZOLOFT) 100 MG tablet Take 200 mg by mouth daily.  . [DISCONTINUED] albuterol (PROVENTIL HFA) 108 (90 Base) MCG/ACT inhaler Inhale 2 puffs into the lungs every 4 (four) hours as needed  for wheezing or shortness of breath.  . [DISCONTINUED] methylPREDNISolone (MEDROL DOSEPAK) 4 MG TBPK tablet Take as directed   No facility-administered encounter medications on file as of 06/08/2020.  :   Review of Systems:  Out of a complete 14 point review of systems, all are reviewed and negative with the exception of these symptoms as listed below: Review of Systems  Neurological:       Pt presents today to discuss her left hand numbness. Half of pt's left hand has been numb since October.    Objective:  Neurological Exam  Physical Exam Physical Examination:   Vitals:   06/08/20 1422  BP: 130/87  Pulse: 94    General  Examination: The patient is a very pleasant 19 y.o. female in no acute distress. She appears well-developed and well-nourished and well groomed. She is mildly anxious appearing.  HEENT: Normocephalic, atraumatic, pupils are equal, round and reactive to light and accommodation. Funduscopic exam is normal with sharp disc margins noted. Extraocular tracking is good without limitation to gaze excursion or nystagmus noted. Normal smooth pursuit is noted. Hearing is grossly intact. Face is symmetric with normal facial animation and normal facial sensation. Speech is clear with no dysarthria noted. There is no hypophonia. There is no lip, neck/head, jaw or voice tremor. Neck is supple with full range of passive and active motion. There are no carotid bruits on auscultation. Oropharynx exam reveals: mild mouth dryness, good dental hygiene. Tongue protrudes centrally and palate elevates symmetrically.   Chest: Clear to auscultation without wheezing, rhonchi or crackles noted.  Heart: S1+S2+0, regular and normal without murmurs, rubs or gallops noted.   Abdomen: Soft, non-tender and non-distended with normal bowel sounds appreciated on auscultation.  Extremities: There is no pitting edema in the distal lower extremities bilaterally. Pedal pulses are intact.  Skin: Warm  and dry with patchy eczematous changes throughout her arms, also rash right around her tattoos and no forearms bilaterally.   Musculoskeletal: exam reveals no obvious joint deformities, tenderness or joint swelling or erythema.   Neurologically:  Mental status: The patient is awake, alert and oriented in all 4 spheres. Her immediate and remote memory, attention, language skills and fund of knowledge are appropriate. There is no evidence of aphasia, agnosia, apraxia or anomia. Speech is clear with normal prosody and enunciation. Thought process is linear. Mood is normal and affect is normal.  Cranial nerves II - XII are as described above under HEENT exam. In addition: shoulder shrug is normal with equal shoulder height noted. Motor exam: Normal bulk, strength and tone is noted. No focal atrophy noted in the left hand.  There is no drift, resting tremor or rebound. She has a slight bilateral upper extremity postural tremor, no significant action tremor or intention tremor. She does admit to feeling somewhat anxious.  Romberg is negative. Reflexes are 2+ throughout. Babinski: Toes are flexor bilaterally. Fine motor skills and coordination: intact with normal finger taps, normal hand movements, normal rapid alternating patting, normal foot taps and normal foot agility.  Cerebellar testing: No dysmetria or intention tremor on finger to nose testing. Heel to shin is unremarkable bilaterally. There is no truncal or gait ataxia.  Sensory exam: intact to light touch, pinprick, vibration, temperature sense in the upper and lower extremities, with the exception of mild decrease in pinprick sensation in the left distal fifth digit, perhaps mild decrease in vibration sense in the left distal digit 5.  Gait, station and balance: She stands easily. No veering to one side is noted. No leaning to one side is noted. Posture is age-appropriate and stance is narrow based. Gait shows normal stride length and normal pace.  No problems turning are noted. Tandem walk is unremarkable.      Assessment and Plan:   In summary, BRIGID VANDEKAMP is a very pleasant 19 y.o.-year old female with an underlying medical history of eczema, asthma, headaches, foot pain, reflux, pneumonia, anxiety, depression, ADD, and OCD (by chart review), and borderline obesity, who presents for evaluation of her numbness of approximately 2 months duration affecting her left ulnar aspect of the hand and pinky finger. Examination is fairly benign with the exception of slight decrease in pinprick sensation  in the distal left digit 5 but overall no focal atrophy, no strength difference, no change in reflex. She has no coordination issues. She does report an episode of left lateral neck pain recently radiating to the left upper shoulder area. She does not have telltale radicular pain and in fact does not have any pain in her left hand.  She does report a remote injury to the left elbow. Differential diagnosis includes compression neuropathy, radiculopathy, unlikely myelopathy.  She had recent blood work through your office, she is advised to follow-up with your office regarding her low normal vitamin B12 level. We will proceed with additional testing through our office in the form of EMG nerve conduction velocity testing of her left upper extremity and MRI of the neck with and without contrast to look for structural changes that could explain her symptoms. She has an appointment with rheumatology pending. I did not suggest any additional blood work from my end of things. We will keep them posted as to her test results by phone call and take it from there. She is also advised to follow-up with her dermatologist regarding her rash and flareup in the eczema.  She also sees an allergy specialist as I understand.  I answered all their questions today and the patient and her mother were in agreement with the plan.  Thank you very much for allowing me to participate in  the care of this nice patient. If I can be of any further assistance to you please do not hesitate to call me at [phone].  Sincerely,   Huston Foley, MD, PhD

## 2020-06-09 ENCOUNTER — Telehealth: Payer: Self-pay | Admitting: Neurology

## 2020-06-09 NOTE — Telephone Encounter (Signed)
[employer] pending. Faxed notes.

## 2020-06-10 NOTE — Telephone Encounter (Signed)
unable to get a hold of the patient her number would not let me leave a vmail  [employer] auth: [id number] (exp. 06/09/20 to 07/24/20)

## 2020-06-14 NOTE — Telephone Encounter (Signed)
no to the covid quesions MR Cervical spine w/wo contrast Dr. Frances Furbish [facility] Auth: [id number] (exp. 06/09/20 to 07/24/20). Patient is scheduled at [facility] for 06/23/20.

## 2020-06-23 ENCOUNTER — Other Ambulatory Visit: Payer: 59

## 2020-06-28 ENCOUNTER — Ambulatory Visit: Payer: 59 | Admitting: Neurology

## 2020-06-28 ENCOUNTER — Ambulatory Visit (INDEPENDENT_AMBULATORY_CARE_PROVIDER_SITE_OTHER): Payer: 59 | Admitting: Neurology

## 2020-06-28 ENCOUNTER — Encounter: Payer: Self-pay | Admitting: Neurology

## 2020-06-28 ENCOUNTER — Telehealth: Payer: Self-pay

## 2020-06-28 ENCOUNTER — Other Ambulatory Visit: Payer: Self-pay

## 2020-06-28 DIAGNOSIS — M542 Cervicalgia: Secondary | ICD-10-CM

## 2020-06-28 DIAGNOSIS — G5622 Lesion of ulnar nerve, left upper limb: Secondary | ICD-10-CM | POA: Insufficient documentation

## 2020-06-28 DIAGNOSIS — M5412 Radiculopathy, cervical region: Secondary | ICD-10-CM

## 2020-06-28 DIAGNOSIS — R200 Anesthesia of skin: Secondary | ICD-10-CM

## 2020-06-28 HISTORY — DX: Lesion of ulnar nerve, left upper limb: G56.22

## 2020-06-28 NOTE — Telephone Encounter (Signed)
I called pt to discuss her EMG/NCV results. No answer, left a message asking her to call me back. 

## 2020-06-28 NOTE — Progress Notes (Signed)
Please refer to EMG and nerve conduction procedure note.  

## 2020-06-28 NOTE — Progress Notes (Addendum)
Please call patient regarding her EMG nerve conduction velocity test results, it shows on the left side that she had an irritation or compression of the left ulnar nerve which would explain her numbness in the hand.  It looks like from the EMG or muscle test result that she may be on the mend or healing.  If she has ongoing issues, we can consult with a orthopedic surgeon for consideration of possible surgical decompression at the elbow.  If she continues to improve, we can also continue to have her monitor her symptoms.  Otherwise, there was no evidence of any widespread nerve or muscle disease.  No evidence of pinched nerve from the neck.      MNC    Nerve / Sites Muscle Latency Ref. Amplitude Ref. Rel Amp Segments Distance Velocity Ref. Area    ms ms mV mV %  cm m/s m/s mVms  L Median - APB     Wrist APB 3.6 ?4.4 12.7 ?4.0 100 Wrist - APB 7   51.7     Upper arm APB 7.5  12.5  98.4 Upper arm - Wrist 22 57 ?49 52.0  L Ulnar - ADM     Wrist ADM 3.0 ?3.3 7.1 ?6.0 100 Wrist - ADM 7   25.9     B.Elbow ADM 7.1  5.8  82.2 B.Elbow - Wrist 21 51 ?49 22.4     A.Elbow ADM 10.0  3.4  58.4 A.Elbow - B.Elbow 10 34 ?49 16.3         A.Elbow - Wrist      R Ulnar - ADM     Wrist ADM 2.6 ?3.3 13.3 ?6.0 100 Wrist - ADM 7   41.5     B.Elbow ADM 6.3  11.8  88.5 B.Elbow - Wrist 22 59 ?49 38.3     A.Elbow ADM 8.1  11.3  95.4 A.Elbow - B.Elbow 10 57 ?49 45.0         A.Elbow - Wrist               SNC    Nerve / Sites Rec. Site Peak Lat Ref.  Amp Ref. Segments Distance    ms ms V V  cm  L Median - Orthodromic (Dig II, Mid palm)     Dig II Wrist 3.2 ?3.4 25 ?10 Dig II - Wrist 13  L Ulnar - Orthodromic, (Dig V, Mid palm)     Dig V Wrist 3.5 ?3.1 3 ?5 Dig V - Wrist 11  R Ulnar - Orthodromic, (Dig V, Mid palm)     Dig V Wrist 3.0 ?3.1 5 ?5 Dig V - Wrist 52           F  Wave    Nerve F Lat Ref.   ms ms  L Ulnar - ADM 32.3 ?32.0

## 2020-06-28 NOTE — Telephone Encounter (Signed)
-----   Message from Huston Foley, MD sent at 06/28/2020  2:23 PM EST ----- Please call patient regarding her EMG nerve conduction velocity test results, it shows on the left side that she had an irritation or compression of the left ulnar nerve which would explain her numbness in the hand.  It looks like from the EMG or muscle test result that she may be on the mend or healing.  If she has ongoing issues, we can consult with a orthopedic surgeon for consideration of possible surgical decompression at the elbow.  If she continues to improve, we can also continue to have her monitor her symptoms.  Otherwise, there was no evidence of any widespread nerve or muscle disease.  No evidence of pinched nerve from the neck.

## 2020-06-28 NOTE — Procedures (Signed)
     HISTORY:  Olivia Wu is a 20 year old patient with a history of onset of numbness in the fourth and fifth fingers of the left hand that began in October 2021.  She had no associated injury to the elbow or forearm.  The patient denies any neck pain or pain down the arm.  She is being evaluated for the left hand issues.  She believes that she is weaker with the left hand.  NERVE CONDUCTION STUDIES:  Nerve conduction studies were performed on the upper extremities bilaterally.  The distal motor latencies and motor amplitudes for the left median nerve and for the ulnar nerves bilaterally were normal.  For the left ulnar nerve there was slowing across the elbow, normal for the right ulnar nerve and normal for the left median nerve.  The sensory latency for the left ulnar nerve is prolonged, but is normal for the right ulnar nerve and for the left median nerve.  The F-wave latency for the left ulnar nerve is prolonged.  EMG STUDIES:  The first dorsal interosseous muscle reveals 2 to 4 K units with slightly reduced recruitment. 2+ positive waves were noted. The abductor pollicis brevis muscle reveals 2 to 4 K units with full recruitment. No fibrillations or positive waves were noted. The extensor indicis proprius muscle reveals 1 to 3 K units with full recruitment. No fibrillations or positive waves were noted. The flexor digitorum profundus muscle (III-IV) reveals 1 to 3 K units with full recruitment.  No fibrillations or positive waves were noted. The pronator teres muscle reveals 2 to 3 K units with full recruitment. No fibrillations or positive waves were noted. The biceps muscle reveals 1 to 2 K units with full recruitment. No fibrillations or positive waves were noted. The triceps muscle reveals 2 to 4 K units with full recruitment. No fibrillations or positive waves were noted. The anterior deltoid muscle reveals 2 to 3 K units with full recruitment. No fibrillations or positive waves were  noted. The cervical paraspinal muscles were tested at 2 levels. No abnormalities of insertional activity were seen at either level tested. There was poor relaxation.   IMPRESSION:  Nerve conduction studies done on both upper extremities shows evidence of slowing across the left elbow for the ulnar nerve consistent with a tardy ulnar palsy.  EMG evaluation of the left upper extremity confirms acute denervation of the first dorsal interosseous muscle consistent with an ulnar neuropathy, with sparing of the flexor digitorum profundus muscle, suggesting the possibility of an early healing process.  No evidence of an overlying cervical radiculopathy was seen.  Marlan Palau MD 06/28/2020 1:58 PM  [facility] [address] [place], [place] [address]  Phone [phone] Fax [phone]

## 2020-06-29 NOTE — Telephone Encounter (Signed)
Pt has called to inform she tried explaining the results of the NCV/EMG to her mother but she still has questions.  Pt is asking Loyola Mast calls her mother(on DPR) to further discuss results.  Mother can be reached at [phone]

## 2020-06-29 NOTE — Telephone Encounter (Signed)
I called pt's mother, Olivia Wu, per DPR. I discussed pt's EMG/NCV results and recommendations. Pt's mother verbalized understanding of results and no further questions.

## 2020-06-29 NOTE — Telephone Encounter (Signed)
I called pt. I discussed her NCV/EMG results and recommendations with her. Pt would like to proceed with cervical MRI as well. Pt verbalized understanding of results. Pt had no questions at this time but was encouraged to call back if questions arise.

## 2020-07-01 NOTE — Telephone Encounter (Signed)
Patient wanted to cancel and did not want to r/s at this time.

## 2020-07-07 ENCOUNTER — Other Ambulatory Visit: Payer: 59

## 2020-07-09 ENCOUNTER — Other Ambulatory Visit: Payer: Self-pay | Admitting: Podiatry

## 2020-07-09 DIAGNOSIS — M216X9 Other acquired deformities of unspecified foot: Secondary | ICD-10-CM

## 2020-07-09 DIAGNOSIS — M7750 Other enthesopathy of unspecified foot: Secondary | ICD-10-CM

## 2020-07-14 ENCOUNTER — Other Ambulatory Visit: Payer: 59

## 2020-11-17 ENCOUNTER — Telehealth: Payer: Self-pay | Admitting: Allergy and Immunology

## 2020-11-17 NOTE — Telephone Encounter (Signed)
Please inform mom that there should be no interaction between dupilumab use and receiving the vaccination.  2 days is an adequate amount of time between those 2 administrations.

## 2020-11-17 NOTE — Telephone Encounter (Signed)
Please advise 

## 2020-11-17 NOTE — Telephone Encounter (Signed)
Mother informed.

## 2020-11-17 NOTE — Telephone Encounter (Signed)
Olivia Wu is on Dupixent and is going tomorrow to get her tetnus shot. Mom called and wanted to know the time between injections. I told her 2 days. She also wanted to know if there might be any interactions with her being on a biologic.

## 2020-12-27 ENCOUNTER — Other Ambulatory Visit: Payer: Self-pay | Admitting: Allergy and Immunology

## 2021-01-12 ENCOUNTER — Other Ambulatory Visit: Payer: Self-pay

## 2021-01-12 ENCOUNTER — Encounter: Payer: Self-pay | Admitting: Allergy and Immunology

## 2021-01-12 ENCOUNTER — Ambulatory Visit: Payer: 59 | Admitting: Allergy and Immunology

## 2021-01-12 VITALS — BP 110/76 | HR 88 | Resp 16 | Ht 67.0 in | Wt 205.0 lb

## 2021-01-12 DIAGNOSIS — J4530 Mild persistent asthma, uncomplicated: Secondary | ICD-10-CM | POA: Diagnosis not present

## 2021-01-12 DIAGNOSIS — J3089 Other allergic rhinitis: Secondary | ICD-10-CM

## 2021-01-12 DIAGNOSIS — L2089 Other atopic dermatitis: Secondary | ICD-10-CM

## 2021-01-12 DIAGNOSIS — T781XXD Other adverse food reactions, not elsewhere classified, subsequent encounter: Secondary | ICD-10-CM

## 2021-01-12 MED ORDER — AUVI-Q 0.3 MG/0.3ML IJ SOAJ: INTRAMUSCULAR | 3 refills | Status: DC

## 2021-01-12 NOTE — Patient Instructions (Addendum)
  1. Continue Dupilumab injections every 2-4 weeks  2. Continue food elimination diet - dairy, egg, peanut  3. Continue Zyrtec 10 mg - 1 tablet 1-2 times per day   4. Continue Pepcid 20 mg - 1 tablet 1-2 times per day  5. If needed:   A. Proventil HFA   B. Auvi-Q 0.3   C. mometasone 0.1% ointment applied 1-2 times a day   D. Flonase 1-2 sprays each nostril once a day    6.  Blood - Milk panel, egg panel, nut panel w/R  7.  Progressive aerobic interval training exercise program not involving shins  8.  Return to clinic in 6 months or earlier if problem

## 2021-01-12 NOTE — Progress Notes (Signed)
Olivia Wu - [place] [facility] [place] [facility] [place] -    Follow-up Note  Referring Provider: Jarrett Soho, PA-C Primary Provider: Jarrett Soho, PA-C Date of Office Visit: 01/12/2021  Subjective:   Olivia Wu (DOB: May 29, 2001) is a 20 y.o. female who [facility] in re-evaluation of the following:  HPI: Olivia Wu returns to this clinic in evaluation of multiorgan atopic disease including atopic dermatitis, asthma, allergic rhinoconjunctivitis, presumed eosinophilic esophagitis, history of urticaria, and a history of food allergy directed against dairy and egg and peanut.  Olivia Wu last visit to this clinic was 21 January 2020.  Olivia Wu eczema intermittently flares up even while using dupilumab and Olivia Wu must use topical mometasone occasionally.  Usually Olivia Wu flareups are associated with inadvertent administration of either egg or dairy.  But overall Olivia Wu has done okay with Olivia Wu skin and has not required a systemic steroid to treat a flareup.  Olivia Wu airway has been under excellent control at this point in time and Olivia Wu rarely uses a short acting bronchodilator.  Olivia Wu has a problem exercising because Olivia Wu has shinsplints.  Likewise, Olivia Wu has had very little problems with Olivia Wu nose.  Olivia Wu reflux appears to be under very good control while using Pepcid.  Allergies as of 01/12/2021       Reactions   Augmentin [amoxicillin-pot Clavulanate] Nausea And Vomiting   Bactrim [sulfamethoxazole-trimethoprim] Hives   Cleocin [clindamycin Hcl] Hives   Codeine    Keflex [cephalexin] Hives   Sulfa Antibiotics         Medication List    Auvi-Q 0.3 mg/0.3 mL Soaj injection Generic drug: EPINEPHrine Use as directed for life-threatening allergic reaction.   Blisovi 24 Fe 1-20 MG-MCG(24) tablet Generic drug: Norethindrone Acetate-Ethinyl Estrad-FE   buPROPion 300 MG 24 hr tablet Commonly known as: WELLBUTRIN XL Take 300 mg by mouth daily.   cetirizine 10 MG  tablet Commonly known as: ZYRTEC Take 10 mg by mouth daily. Take 1 by mouth every night at bedtime.   cloNIDine 0.1 MG tablet Commonly known as: CATAPRES Take 0.1 mg by mouth at bedtime.   Dupixent 300 MG/2ML prefilled syringe Generic drug: dupilumab INJECT 300MG  SUBCUTANEOUSLY EVERY OTHER WEEK   famotidine 20 MG tablet Commonly known as: PEPCID Take 20 mg by mouth 2 (two) times daily.   mometasone 0.1 % ointment Commonly known as: ELOCON Apply sparingly to red, itchy areas once or twice daily.   sertraline 100 MG tablet Commonly known as: ZOLOFT Take 200 mg by mouth daily.        Past Medical History:  Diagnosis Date   Asthma    Eczema    Headache    Ulnar neuropathy at elbow, left 06/28/2020   Urticaria     History reviewed. No pertinent surgical history.  Review of systems negative except as noted in HPI / PMHx or noted below:  Review of Systems  Constitutional: Negative.   HENT: Negative.    Eyes: Negative.   Respiratory: Negative.    Cardiovascular: Negative.   Gastrointestinal: Negative.   Genitourinary: Negative.   Musculoskeletal: Negative.   Skin: Negative.   Neurological: Negative.   Endo/Heme/Allergies: Negative.   Psychiatric/Behavioral: Negative.      Objective:   Vitals:   01/12/21 1039  BP: 110/76  Pulse: 88  Resp: 16  SpO2: 97%   Height: 5\' 7"  (170.2 cm)  Weight: 205 lb (93 kg)   Physical Exam Constitutional:      Appearance: Olivia Wu is not  diaphoretic.  HENT:     Head: Normocephalic.     Right Ear: Tympanic membrane, ear canal and external ear normal.     Left Ear: Tympanic membrane, ear canal and external ear normal.     Nose: Nose normal. No mucosal edema or rhinorrhea.     Mouth/Throat:     Pharynx: Uvula midline. No oropharyngeal exudate.  Eyes:     Conjunctiva/sclera: Conjunctivae normal.  Neck:     Thyroid: No thyromegaly.     Trachea: Trachea normal. No tracheal tenderness or tracheal deviation.  Cardiovascular:      Rate and Rhythm: Normal rate and regular rhythm.     Heart sounds: Normal heart sounds, S1 normal and S2 normal. No murmur heard. Pulmonary:     Effort: No respiratory distress.     Breath sounds: Normal breath sounds. No stridor. No wheezing or rales.  Lymphadenopathy:     Head:     Right side of head: No tonsillar adenopathy.     Left side of head: No tonsillar adenopathy.     Cervical: No cervical adenopathy.  Skin:    Findings: Rash (Patchy scaly relatively flat areas involving forearms and thighs) present. No erythema.     Nails: There is no clubbing.  Neurological:     Mental Status: Olivia Wu is alert.    Diagnostics: none  Assessment and Plan:   1. Asthma, well controlled, mild persistent   2. Other allergic rhinitis   3. Other atopic dermatitis   4. Adverse food reaction, subsequent encounter     1. Continue Dupilumab injections every 2-4 weeks  2. Continue food elimination diet - dairy, egg, peanut  3. Continue Zyrtec 10 mg - 1 tablet 1-2 times per day   4. Continue Pepcid 20 mg - 1 tablet 1-2 times per day  5. If needed:   A. Proventil HFA   B. Auvi-Q 0.3   C. mometasone 0.1% ointment applied 1-2 times a day   D. Flonase 1-2 sprays each nostril once a day    6.  Blood - Milk panel, egg panel, nut panel w/R  7.  Progressive aerobic interval training exercise program not involving shins  8.  Return to clinic in 6 months or earlier if problem  Camara appears to be doing relatively well with Olivia Wu dupilumab administration regarding control of Olivia Wu atopic disease.  And Olivia Wu reflux appears to be okay while using a H2 receptor blocker.  We will work through Olivia Wu food allergy in more detail today by looking at antigen specific IgE antibodies directed against specific food components.  I have also made the recommendation that Olivia Wu undergo some form of aerobic exercise on a consistent basis but obviously Olivia Wu is going to have to work around Olivia Wu shinsplints which apparently have been  a relatively significant issue recently.  I will see Olivia Wu back in his clinic in 6 months or earlier if there is a problem.   Laurette Schimke, MD Allergy / Immunology [facility]

## 2021-01-13 ENCOUNTER — Encounter: Payer: Self-pay | Admitting: Allergy and Immunology

## 2021-01-16 LAB — EGG COMPONENT PANEL
F232-IgE Ovalbumin: <0.1 kU/L
F233-IgE Ovomucoid: <0.1 kU/L

## 2021-01-16 LAB — IGE NUT PROF. W/COMPONENT RFLX
F017-IgE Hazelnut (Filbert): <0.1 kU/L
F018-IgE Brazil Nut: <0.1 kU/L
F020-IgE Almond: <0.1 kU/L
F202-IgE Cashew Nut: <0.1 kU/L
F203-IgE Pistachio Nut: <0.1 kU/L
F256-IgE Walnut: <0.1 kU/L
Macadamia Nut, IgE: <0.1 kU/L
Peanut, IgE: 1.82 kU/L — AB
Pecan Nut IgE: <0.1 kU/L

## 2021-01-16 LAB — MILK COMPONENT PANEL
F076-IgE Alpha Lactalbumin: <0.1 kU/L
F077-IgE Beta Lactoglobulin: <0.1 kU/L
F078-IgE Casein: <0.1 kU/L

## 2021-01-16 LAB — PEANUT COMPONENTS
F352-IgE Ara h 8: <0.1 kU/L
F422-IgE Ara h 1: <0.1 kU/L
F423-IgE Ara h 2: 1.2 kU/L — AB
F424-IgE Ara h 3: <0.1 kU/L
F427-IgE Ara h 9: <0.1 kU/L
F447-IgE Ara h 6: 0.66 kU/L — AB

## 2021-01-16 LAB — ALLERGEN COMPONENT COMMENTS

## 2021-02-09 ENCOUNTER — Ambulatory Visit: Payer: 59 | Admitting: Behavioral Health

## 2021-02-22 ENCOUNTER — Other Ambulatory Visit: Payer: Self-pay

## 2021-02-22 ENCOUNTER — Encounter: Payer: Self-pay | Admitting: Behavioral Health

## 2021-02-22 ENCOUNTER — Ambulatory Visit (INDEPENDENT_AMBULATORY_CARE_PROVIDER_SITE_OTHER): Payer: 59 | Admitting: Behavioral Health

## 2021-02-22 VITALS — BP 136/93 | HR 80 | Ht 67.0 in | Wt 205.0 lb

## 2021-02-22 DIAGNOSIS — F900 Attention-deficit hyperactivity disorder, predominantly inattentive type: Secondary | ICD-10-CM

## 2021-02-22 DIAGNOSIS — F411 Generalized anxiety disorder: Secondary | ICD-10-CM | POA: Diagnosis not present

## 2021-02-22 DIAGNOSIS — F3341 Major depressive disorder, recurrent, in partial remission: Secondary | ICD-10-CM

## 2021-02-22 MED ORDER — BUPROPION HCL ER (XL) 300 MG PO TB24: 300.0000 mg | ORAL_TABLET | Freq: Every day | ORAL | 1 refills | Status: DC

## 2021-02-22 NOTE — Progress Notes (Signed)
[facility] MD/PA/NP Initial Note  02/22/2021 2:31 PM Olivia Wu  MRN:  [id number]  Chief Complaint:  Chief Complaint   Anxiety; Depression; Establish Care; Follow-up     HPI:  20 year old female presents to this office for initial visit and to establish care. Says that she recently did not feel the need to continue psychotherapy and wanted to establish care for someone to manage her medications. Says that she has struggled with anxiety, depression and ADHD, since her teenage years but finally has had good success with Wellbutrin and Zoloft. She says her anxiety is 2/10 and depression is 1/10 currently and she does not want to adjust medications at this time. She would like to get refills and commits to regular follow ups. Denies mania, no psychosis. No SI/HI.  No prior medication failures.    Visit Diagnosis:    ICD-10-CM   1. Attention deficit hyperactivity disorder (ADHD), predominantly inattentive type  F90.0 buPROPion (WELLBUTRIN XL) 300 MG 24 hr tablet    2. Generalized anxiety disorder  F41.1 buPROPion (WELLBUTRIN XL) 300 MG 24 hr tablet    3. Recurrent major depressive disorder, in partial remission (HCC)  F33.41 buPROPion (WELLBUTRIN XL) 300 MG 24 hr tablet      Past Psychiatric History: anxiety, depression, ADHD. Previous care by [facility]  Past Medical History:  Past Medical History:  Diagnosis Date   Asthma    Eczema    Headache    Ulnar neuropathy at elbow, left 06/28/2020   Urticaria    History reviewed. No pertinent surgical history.  Family Psychiatric History: see chart  Family History:  Family History  Problem Relation Age of Onset   Depression Mother        Hx of post-partum depression-Resolved   Headache Father        Hx of Ha's as a child-Resolved   Depression Father    Anxiety disorder Father    Depression Paternal Grandmother    ADD / ADHD Cousin        several cousins with ADD/ADHD    Social History:  Social History    Socioeconomic History   Marital status: Single    Spouse name: Not on file   Number of children: Not on file   Years of education: 15   Highest education level: Bachelor's degree (e.g., BA, AB, BS)  Occupational History   Occupation: Full Time Student  Tobacco Use   Smoking status: Never   Smokeless tobacco: Never  Substance and Sexual Activity   Alcohol use: No    Alcohol/week: 0.0 standard drinks   Drug use: No   Sexual activity: Never    Birth control/protection: Abstinence, Pill  Other Topics Concern   Not on file  Social History Narrative   Lives with both parents in [place].  Has best friend at church she goes out with in free time.   Social Determinants of Health   Financial Resource Strain: Not on file  Food Insecurity: Not on file  Transportation Needs: Not on file  Physical Activity: Not on file  Stress: Not on file  Social Connections: Not on file    Allergies:  Allergies  Allergen Reactions   Augmentin [Amoxicillin-Pot Clavulanate] Nausea And Vomiting   Bactrim [Sulfamethoxazole-Trimethoprim] Hives   Cleocin [Clindamycin Hcl] Hives   Codeine    Keflex [Cephalexin] Hives   Sulfa Antibiotics     Metabolic Disorder Labs: No results found for: HGBA1C, MPG No results found for: PROLACTIN No results found  for: CHOL, TRIG, HDL, CHOLHDL, VLDL, LDLCALC Lab Results  Component Value Date   TSH 1.680 01/22/2020    Therapeutic Level Labs: No results found for: LITHIUM No results found for: VALPROATE No components found for:  CBMZ  Current Medications: Current Outpatient Medications  Medication Sig Dispense Refill   AUVI-Q 0.3 MG/0.3ML SOAJ injection Use as directed for life-threatening allergic reaction. 1 each 3   BLISOVI 24 FE 1-20 MG-MCG(24) tablet      cetirizine (ZYRTEC) 10 MG tablet Take 10 mg by mouth daily. Take 1 by mouth every night at bedtime.     cloNIDine (CATAPRES) 0.1 MG tablet Take 0.1 mg by mouth at bedtime.     DUPIXENT 300  MG/2ML prefilled syringe INJECT 300MG  SUBCUTANEOUSLY EVERY OTHER WEEK 4 mL 11   famotidine (PEPCID) 20 MG tablet Take 20 mg by mouth 2 (two) times daily.     sertraline (ZOLOFT) 100 MG tablet Take 200 mg by mouth daily.     buPROPion (WELLBUTRIN XL) 300 MG 24 hr tablet Take 1 tablet (300 mg total) by mouth daily. 90 tablet 1   mometasone (ELOCON) 0.1 % ointment Apply sparingly to red, itchy areas once or twice daily. (Patient not taking: Reported on 02/22/2021) 90 g 5   No current facility-administered medications for this visit.    Medication Side Effects: none  Orders placed this visit:  No orders of the defined types were placed in this encounter.   Psychiatric Specialty Exam:  Review of Systems  Constitutional: Negative.   Respiratory:  Positive for shortness of breath.   Skin:  Positive for rash.  Allergic/Immunologic: Positive for environmental allergies.  Neurological:  Positive for dizziness.  Psychiatric/Behavioral:  Positive for dysphoric mood. The patient is nervous/anxious.    Blood pressure (!) 136/93, pulse 80, height 5\' 7"  (1.702 m), weight 205 lb (93 kg).Body mass index is 32.11 kg/m.  General Appearance: Casual and Neat  Eye Contact:  Good  Speech:  Clear and Coherent  Volume:  Normal  Mood:  NA  Affect:  Appropriate  Thought Process:  Coherent  Orientation:  Full (Time, Place, and Person)  Thought Content: Logical   Suicidal Thoughts:  No  Homicidal Thoughts:  No  Memory:  WNL  Judgement:  Good  Insight:  Good  Psychomotor Activity:  Normal  Concentration:  Concentration: Good  Recall:  Good  Fund of Knowledge: Good  Language: Good  Assets:  Desire for Improvement  ADL's:  Intact  Cognition: WNL  Prognosis:  Good   Screenings:  PHQ2-9    Flowsheet Row Office Visit from 02/22/2021 in [facility]  PHQ-2 Total Score 0       Receiving Psychotherapy: No   Treatment Plan/Recommendations:  Greater than 50% of  60 min face to face  time with patient was spent on counseling and coordination of care. We discussed history of anxiety, depression, and ADHD. We family history and social dynamics. We agreed to: Continue Zoloft 100 mg at bedtime Continue Wellbutrin 300 mg XL in the am Will report any side effects or worsening symptoms To follow up in 3 months to reassess. Provided emergency contact information.    , NP

## 2021-04-22 ENCOUNTER — Other Ambulatory Visit: Payer: Self-pay | Admitting: Behavioral Health

## 2021-04-22 MED ORDER — SERTRALINE HCL 100 MG PO TABS: 150.0000 mg | ORAL_TABLET | Freq: Every day | ORAL | 3 refills | Status: DC

## 2021-04-22 NOTE — Telephone Encounter (Signed)
Pt called and said that she needs a refill on her zoloft. She takes a 150 mg once a day.Please send it to [facility] on [address]

## 2021-04-22 NOTE — Telephone Encounter (Signed)
Noted pt is taking Zoloft 150 mg daily

## 2021-04-26 ENCOUNTER — Telehealth: Payer: Self-pay | Admitting: Behavioral Health

## 2021-04-26 NOTE — Telephone Encounter (Signed)
Mom called and said that  Shannie's zoloft needs to be sent to [facility]. Please get rid of all other pharmacies except for [facility] and [facility]

## 2021-04-27 ENCOUNTER — Other Ambulatory Visit: Payer: Self-pay

## 2021-04-27 MED ORDER — SERTRALINE HCL 100 MG PO TABS: 150.0000 mg | ORAL_TABLET | Freq: Every day | ORAL | 3 refills | Status: DC

## 2021-04-27 NOTE — Telephone Encounter (Signed)
Olivia Wu, I sent to [facility] and updated her pharmacy list

## 2021-04-27 NOTE — Telephone Encounter (Signed)
Thank you :)

## 2021-05-02 ENCOUNTER — Ambulatory Visit: Payer: Self-pay | Admitting: Allergy and Immunology

## 2021-05-23 ENCOUNTER — Ambulatory Visit: Payer: Self-pay | Admitting: Allergy and Immunology

## 2021-05-24 ENCOUNTER — Encounter: Payer: Self-pay | Admitting: Behavioral Health

## 2021-05-24 ENCOUNTER — Ambulatory Visit: Payer: 59 | Admitting: Behavioral Health

## 2021-05-24 ENCOUNTER — Other Ambulatory Visit: Payer: Self-pay

## 2021-05-24 VITALS — Wt 195.0 lb

## 2021-05-24 DIAGNOSIS — F3341 Major depressive disorder, recurrent, in partial remission: Secondary | ICD-10-CM

## 2021-05-24 DIAGNOSIS — F411 Generalized anxiety disorder: Secondary | ICD-10-CM | POA: Diagnosis not present

## 2021-05-24 DIAGNOSIS — F900 Attention-deficit hyperactivity disorder, predominantly inattentive type: Secondary | ICD-10-CM

## 2021-05-24 MED ORDER — SERTRALINE HCL 100 MG PO TABS: 150.0000 mg | ORAL_TABLET | Freq: Every day | ORAL | 3 refills | Status: DC

## 2021-05-24 MED ORDER — TRAZODONE HCL 50 MG PO TABS: 50.0000 mg | ORAL_TABLET | Freq: Every day | ORAL | 2 refills | Status: DC

## 2021-05-24 MED ORDER — BUPROPION HCL ER (XL) 300 MG PO TB24: 300.0000 mg | ORAL_TABLET | Freq: Every day | ORAL | 2 refills | Status: DC

## 2021-05-24 NOTE — Progress Notes (Signed)
[facility] Med Check  Patient ID: Olivia Wu,  MRN: [id number]  PCP: Marda Stalker, PA-C  Date of Evaluation: 05/24/2021 Time spent:30 minutes  Chief Complaint:  Chief Complaint   Anxiety; Depression; Follow-up; Medication Refill     HISTORY/CURRENT STATUS: HPI  20 year old female presents to this office for initial visit and to establish care. She is feeling very stable with her Anxiety and depression as well as ADHD. Says the Wellbutrin helps with her attention and focus. Says she continues to struggle with falling asleep and then waking up in middle of night. 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 She is interested in medication to help with that. She says her anxiety is 2/10 and depression is 1/10 currently. She would like to get refills and commits to regular follow ups. Denies mania, no psychosis. No SI/HI.   No prior medication failures.    Individual Medical History/ Review of Systems: Changes? :No   Allergies: Augmentin [amoxicillin-pot clavulanate], Bactrim [sulfamethoxazole-trimethoprim], Cleocin [clindamycin hcl], Codeine, Keflex [cephalexin], and Sulfa antibiotics  Current Medications:  Current Outpatient Medications:    traZODone (DESYREL) 50 MG tablet, Take 1 tablet (50 mg total) by mouth at bedtime., Disp: 30 tablet, Rfl: 2   AUVI-Q 0.3 MG/0.3ML SOAJ injection, Use as directed for life-threatening allergic reaction., Disp: 1 each, Rfl: 3   BLISOVI 24 FE 1-20 MG-MCG(24) tablet, , Disp: , Rfl:    buPROPion (WELLBUTRIN XL) 300 MG 24 hr tablet, Take 1 tablet (300 mg total) by mouth daily., Disp: 90 tablet, Rfl: 2   cetirizine (ZYRTEC) 10 MG tablet, Take 10 mg by mouth daily. Take 1 by mouth every night at bedtime., Disp: , Rfl:    cloNIDine (CATAPRES) 0.1 MG tablet, Take 0.1 mg by mouth at bedtime., Disp: , Rfl:    DUPIXENT 300 MG/2ML prefilled syringe, INJECT 300MG  SUBCUTANEOUSLY EVERY OTHER WEEK, Disp: 4 mL, Rfl: 11   famotidine  (PEPCID) 20 MG tablet, Take 20 mg by mouth 2 (two) times daily., Disp: , Rfl:    mometasone (ELOCON) 0.1 % ointment, Apply sparingly to red, itchy areas once or twice daily. (Patient not taking: Reported on 02/22/2021), Disp: 90 g, Rfl: 5   sertraline (ZOLOFT) 100 MG tablet, Take 1.5 tablets (150 mg total) by mouth daily., Disp: 45 tablet, Rfl: 3 Medication Side Effects: none  Family Medical/ Social History: Changes? No  MENTAL HEALTH EXAM:  Weight 195 lb (88.5 kg).Body mass index is 30.54 kg/m.  General Appearance: Casual  Eye Contact:  Good  Speech:  Clear and Coherent  Volume:  Normal  Mood:  NA  Affect:  Appropriate  Thought Process:  Coherent  Orientation:  Full (Time, Place, and Person)  Thought Content: Logical   Suicidal Thoughts:  No  Homicidal Thoughts:  No  Memory:  WNL  Judgement:  Good  Insight:  Good  Psychomotor Activity:  Normal  Concentration:  Concentration: Good  Recall:  Good  Fund of Knowledge: Good  Language: Good  Assets:  Desire for Improvement  ADL's:  Intact  Cognition: WNL  Prognosis:  Good    DIAGNOSES:    ICD-10-CM   1. Attention deficit hyperactivity disorder (ADHD), predominantly inattentive type  F90.0 sertraline (ZOLOFT) 100 MG tablet    buPROPion (WELLBUTRIN XL) 300 MG 24 hr tablet    traZODone (DESYREL) 50 MG tablet    2. Generalized anxiety disorder  F41.1 sertraline (ZOLOFT) 100 MG tablet    buPROPion (WELLBUTRIN XL) 300 MG 24  hr tablet    traZODone (DESYREL) 50 MG tablet    3. Recurrent major depressive disorder, in partial remission (HCC)  F33.41 sertraline (ZOLOFT) 100 MG tablet    buPROPion (WELLBUTRIN XL) 300 MG 24 hr tablet      Receiving Psychotherapy: No    RECOMMENDATIONS:   Treatment Plan/Recommendations:  Greater than 50% of  60 min face to face time with patient was spent on counseling and coordination of care. We discussed her current stability with anxiety, depression, and ADHD. We family history and social  dynamics.  She is still having inconsistent sleeping patterns. We agreed to: Continue Zoloft 100 mg at bedtime Continue Wellbutrin 300 mg XL in the am To start Trazodone 50 mg daily at bedtime Will report any side effects or worsening symptoms To follow up in 3 months to reassess. Provided emergency contact information. Reviewed PDMP           Joan Flores, NP

## 2021-06-09 ENCOUNTER — Other Ambulatory Visit: Payer: Self-pay | Admitting: Obstetrics and Gynecology

## 2021-06-09 ENCOUNTER — Ambulatory Visit: Payer: Self-pay | Admitting: Allergy and Immunology

## 2021-06-09 DIAGNOSIS — N6324 Unspecified lump in the left breast, lower inner quadrant: Secondary | ICD-10-CM

## 2021-06-10 ENCOUNTER — Other Ambulatory Visit: Payer: 59

## 2021-07-15 ENCOUNTER — Ambulatory Visit
Admission: RE | Admit: 2021-07-15 | Discharge: 2021-07-15 | Disposition: A | Discharge status: Home / Self Care | Payer: 59 | Source: Ambulatory Visit | Attending: Obstetrics and Gynecology | Admitting: Obstetrics and Gynecology

## 2021-07-15 DIAGNOSIS — N6324 Unspecified lump in the left breast, lower inner quadrant: Secondary | ICD-10-CM

## 2021-07-18 ENCOUNTER — Ambulatory Visit: Payer: 59 | Admitting: Allergy and Immunology

## 2021-07-25 ENCOUNTER — Ambulatory Visit: Payer: 59 | Admitting: Behavioral Health

## 2021-07-25 ENCOUNTER — Other Ambulatory Visit: Payer: Self-pay

## 2021-07-25 ENCOUNTER — Encounter: Payer: Self-pay | Admitting: Behavioral Health

## 2021-07-25 DIAGNOSIS — F900 Attention-deficit hyperactivity disorder, predominantly inattentive type: Secondary | ICD-10-CM

## 2021-07-25 DIAGNOSIS — F411 Generalized anxiety disorder: Secondary | ICD-10-CM | POA: Diagnosis not present

## 2021-07-25 DIAGNOSIS — F3341 Major depressive disorder, recurrent, in partial remission: Secondary | ICD-10-CM | POA: Diagnosis not present

## 2021-07-25 MED ORDER — SERTRALINE HCL 100 MG PO TABS: 150.0000 mg | ORAL_TABLET | Freq: Every day | ORAL | 3 refills | Status: DC

## 2021-07-25 MED ORDER — HYDROXYZINE HCL 25 MG PO TABS: 25.0000 mg | ORAL_TABLET | Freq: Two times a day (BID) | ORAL | 2 refills | Status: DC

## 2021-07-25 MED ORDER — BUPROPION HCL ER (XL) 300 MG PO TB24: 300.0000 mg | ORAL_TABLET | Freq: Every day | ORAL | 2 refills | Status: DC

## 2021-07-25 NOTE — Progress Notes (Signed)
[facility] Check  Patient ID: Olivia Wu,  MRN: [id number]  PCP: Marda Stalker, PA-C  Date of Evaluation: 07/25/2021 Time spent:30 minutes  Chief Complaint:  Chief Complaint   Anxiety; Depression; ADHD; Follow-up; Medication Refill     HISTORY/CURRENT STATUS: HPI  21 year old female presents to this office for initial visit and to establish care. She is feeling very stable with her Anxiety and depression as well as ADHD. Says the Wellbutrin helps with her attention and focus. Her sleep has improved and has not used the Trazodone often. 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 She is very happy with her medication and would like to continue with this regimen.  She says her anxiety is 0/10 and depression is 0/10 currently. She would like to get refills and commits to regular follow ups. Denies mania, no psychosis. No SI/HI.   No prior medication failures.    Individual Medical History/ Review of Systems: Changes? :No   Allergies: Augmentin [amoxicillin-pot clavulanate], Bactrim [sulfamethoxazole-trimethoprim], Cleocin [clindamycin hcl], Codeine, Keflex [cephalexin], and Sulfa antibiotics  Current Medications:  Current Outpatient Medications:    BLISOVI 24 FE 1-20 MG-MCG(24) tablet, , Disp: , Rfl:    cetirizine (ZYRTEC) 10 MG tablet, Take 10 mg by mouth daily. Take 1 by mouth every night at bedtime., Disp: , Rfl:    DUPIXENT 300 MG/2ML prefilled syringe, INJECT 300MG  SUBCUTANEOUSLY EVERY OTHER WEEK, Disp: 4 mL, Rfl: 11   mometasone (ELOCON) 0.1 % ointment, Apply sparingly to red, itchy areas once or twice daily., Disp: 90 g, Rfl: 5   traZODone (DESYREL) 50 MG tablet, Take 1 tablet (50 mg total) by mouth at bedtime., Disp: 30 tablet, Rfl: 2   AUVI-Q 0.3 MG/0.3ML SOAJ injection, Use as directed for life-threatening allergic reaction. (Patient not taking: Reported on 07/25/2021), Disp: 1 each, Rfl: 3   buPROPion (WELLBUTRIN XL) 300 MG 24 hr tablet, Take  1 tablet (300 mg total) by mouth daily., Disp: 90 tablet, Rfl: 2   famotidine (PEPCID) 20 MG tablet, Take 20 mg by mouth 2 (two) times daily., Disp: , Rfl:    hydrOXYzine (ATARAX) 25 MG tablet, Take 1 tablet (25 mg total) by mouth 2 (two) times daily., Disp: 30 tablet, Rfl: 2   sertraline (ZOLOFT) 100 MG tablet, Take 1.5 tablets (150 mg total) by mouth daily., Disp: 45 tablet, Rfl: 3 Medication Side Effects: none  Family Medical/ Social History: Changes? No  MENTAL HEALTH EXAM:  There were no vitals taken for this visit.There is no height or weight on file to calculate BMI.  General Appearance: Casual and Neat  Eye Contact:  Good  Speech:  Clear and Coherent  Volume:  Normal  Mood:  NA  Affect:  Appropriate  Thought Process:  Coherent  Orientation:  Full (Time, Place, and Person)  Thought Content: Logical   Suicidal Thoughts:  No  Homicidal Thoughts:  No  Memory:  WNL  Judgement:  Good  Insight:  Good  Psychomotor Activity:  Normal  Concentration:  Concentration: Good  Recall:  Good  Fund of Knowledge: Good  Language: Good  Assets:  Desire for Improvement  ADL's:  Intact  Cognition: WNL  Prognosis:  Good    DIAGNOSES:    ICD-10-CM   1. Attention deficit hyperactivity disorder (ADHD), predominantly inattentive type  F90.0 buPROPion (WELLBUTRIN XL) 300 MG 24 hr tablet    sertraline (ZOLOFT) 100 MG tablet    2. Generalized anxiety disorder  F41.1 buPROPion (WELLBUTRIN XL)  300 MG 24 hr tablet    sertraline (ZOLOFT) 100 MG tablet    hydrOXYzine (ATARAX) 25 MG tablet    3. Recurrent major depressive disorder, in partial remission (HCC)  F33.41 buPROPion (WELLBUTRIN XL) 300 MG 24 hr tablet    sertraline (ZOLOFT) 100 MG tablet      Receiving Psychotherapy: No    RECOMMENDATIONS:   Treatment Plan/Recommendations:  Greater than 50% of  60 min face to face time with patient was spent on counseling and coordination of care. We discussed her current stability with anxiety,  depression, and ADHD. We family history and social dynamics.  Her sleep has improved since last visit. We agreed to: Continue Zoloft 150 mg at bedtime Continue Wellbutrin 300 mg XL in the am Continue 50 mg daily at bedtime Continue Hydroxyzine 25 mg twice daily  Will report any side effects or worsening symptoms To follow up in 3 months to reassess. Provided emergency contact information. Reviewed PDMP      Elwanda [place], NP

## 2021-08-01 ENCOUNTER — Other Ambulatory Visit: Payer: Self-pay | Admitting: Surgery

## 2021-08-25 ENCOUNTER — Other Ambulatory Visit: Payer: Self-pay | Admitting: Surgery

## 2021-10-21 ENCOUNTER — Ambulatory Visit: Payer: 59 | Admitting: Behavioral Health

## 2021-10-27 ENCOUNTER — Ambulatory Visit: Payer: 59 | Admitting: Behavioral Health

## 2021-10-27 ENCOUNTER — Encounter: Payer: Self-pay | Admitting: Behavioral Health

## 2021-10-27 DIAGNOSIS — F900 Attention-deficit hyperactivity disorder, predominantly inattentive type: Secondary | ICD-10-CM | POA: Diagnosis not present

## 2021-10-27 DIAGNOSIS — F411 Generalized anxiety disorder: Secondary | ICD-10-CM

## 2021-10-27 DIAGNOSIS — F3341 Major depressive disorder, recurrent, in partial remission: Secondary | ICD-10-CM | POA: Diagnosis not present

## 2021-10-27 MED ORDER — SERTRALINE HCL 100 MG PO TABS: 150.0000 mg | ORAL_TABLET | Freq: Every day | ORAL | 3 refills | Status: DC

## 2021-10-27 MED ORDER — HYDROXYZINE HCL 25 MG PO TABS: 25.0000 mg | ORAL_TABLET | Freq: Two times a day (BID) | ORAL | 3 refills | Status: DC

## 2021-10-27 MED ORDER — TRAZODONE HCL 50 MG PO TABS: 50.0000 mg | ORAL_TABLET | Freq: Every day | ORAL | 3 refills | Status: DC

## 2021-10-27 NOTE — Progress Notes (Signed)
[facility] ? ?Patient ID: Olivia Wu,  ?MRN: [id number] ? ?PCP: Jarrett Soho, PA-C ? ?Date of Evaluation: 10/27/2021 ?Time spent:30 minutes ? ?Chief Complaint:  ?Chief Complaint   ?Anxiety; Depression; Follow-up; Medication Refill; Medication Problem ?  ? ? ?HISTORY/CURRENT STATUS: ?HPI ? ?21 year old female presents to this office for initial visit and to establish care. She is feeling very stable with her Anxiety and depression as well as ADHD. The Wellbutrin has continued to assist her with attention and focus. She is very happy with her medication regimen now but believes her [facility] is affecting her moods and making it worse. She says this worsened after taking BC that stopped her periods. Her sleep has improved and has not used the Trazodone often. 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  She says her anxiety is 0/10 and depression is 0/10 currently. She would like to get refills and commits to regular follow ups. Denies mania, no psychosis. No SI/HI. ?  ?No prior medication failures.  ? ? ? ?Individual Medical History/ Review of Systems: Changes? :No  ? ?Allergies: Augmentin [amoxicillin-pot clavulanate], Bactrim [sulfamethoxazole-trimethoprim], Cleocin [clindamycin hcl], Codeine, Keflex [cephalexin], and Sulfa antibiotics ? ?Current Medications:  ?Current Outpatient Medications:  ?  AUVI-Q 0.3 MG/0.3ML SOAJ injection, Use as directed for life-threatening allergic reaction. (Patient not taking: Reported on 07/25/2021), Disp: 1 each, Rfl: 3 ?  BLISOVI 24 FE 1-20 MG-MCG(24) tablet, , Disp: , Rfl:  ?  buPROPion (WELLBUTRIN XL) 300 MG 24 hr tablet, Take 1 tablet (300 mg total) by mouth daily., Disp: 90 tablet, Rfl: 2 ?  cetirizine (ZYRTEC) 10 MG tablet, Take 10 mg by mouth daily. Take 1 by mouth every night at bedtime., Disp: , Rfl:  ?  famotidine (PEPCID) 20 MG tablet, Take 20 mg by mouth 2 (two) times daily., Disp: , Rfl:  ?  hydrOXYzine (ATARAX) 25 MG tablet, Take 1 tablet  (25 mg total) by mouth 2 (two) times daily., Disp: 30 tablet, Rfl: 3 ?  mometasone (ELOCON) 0.1 % ointment, Apply sparingly to red, itchy areas once or twice daily., Disp: 90 g, Rfl: 5 ?  sertraline (ZOLOFT) 100 MG tablet, Take 1.5 tablets (150 mg total) by mouth daily., Disp: 45 tablet, Rfl: 3 ?  traZODone (DESYREL) 50 MG tablet, Take 1 tablet (50 mg total) by mouth at bedtime., Disp: 30 tablet, Rfl: 3 ?Medication Side Effects: none ? ?Family Medical/ Social History: Changes? No ? ?MENTAL HEALTH EXAM: ? ?There were no vitals taken for this visit.There is no height or weight on file to calculate BMI.  ?General Appearance: Casual and Neat  ?Eye Contact:  Good  ?Speech:  Clear and Coherent  ?Volume:  Normal  ?Mood:  NA  ?Affect:  Appropriate  ?Thought Process:  Coherent  ?Orientation:  Full (Time, Place, and Person)  ?Thought Content: Logical   ?Suicidal Thoughts:  No  ?Homicidal Thoughts:  No  ?Memory:  WNL  ?Judgement:  Good  ?Insight:  Good  ?Psychomotor Activity:  Normal  ?Concentration:  Concentration: Good  ?Recall:  Good  ?Fund of Knowledge: Good  ?Language: Good  ?Assets:  Desire for Improvement  ?ADL's:  Intact  ?Cognition: WNL  ?Prognosis:  Good  ? ? ?DIAGNOSES:  ?  ICD-10-CM   ?1. Attention deficit hyperactivity disorder (ADHD), predominantly inattentive type  F90.0 sertraline (ZOLOFT) 100 MG tablet  ?  traZODone (DESYREL) 50 MG tablet  ?  ?2. Generalized anxiety disorder  F41.1 hydrOXYzine (ATARAX) 25  MG tablet  ?  sertraline (ZOLOFT) 100 MG tablet  ?  traZODone (DESYREL) 50 MG tablet  ?  ?3. Recurrent major depressive disorder, in partial remission (HCC)  F33.41 sertraline (ZOLOFT) 100 MG tablet  ?  ? ? ?Receiving Psychotherapy: No  ? ? ?RECOMMENDATIONS:  ?Treatment Plan/Recommendations:  ?Greater than 50% of  30 min face to face time with patient was spent on counseling and coordination of care. We discussed her current stability with anxiety, depression, and ADHD. She had concerns about becoming  markedly more depressed since taking BC and she is having mood changes twice a month that aligns with [facility]. I instructed her to follow up with OB/Gyn to see if there are alternative BC.  ? ? We agreed to: ?Continue Zoloft 150 mg at bedtime ?Continue Wellbutrin 300 mg XL in the am ?Continue 50 mg daily at bedtime ?Continue Hydroxyzine 25 mg twice daily ?  ?Will report any side effects or worsening symptoms ?To follow up in 3 months to reassess. ?Provided emergency contact information. ?Reviewed PDMP ?  ? ? ? ? ?Joan Flores, NP  ?

## 2021-12-14 ENCOUNTER — Telehealth: Payer: Self-pay | Admitting: Behavioral Health

## 2021-12-14 NOTE — Telephone Encounter (Signed)
[name] called on behalf of [name]. They have concerns, along with her PCP with an elevated BP. Mom is inquiring if her medications ,( Sertraline, Wellbutrin) could be contributing to this problem. Mom is listed on the DPR her contact info is Olivia Wu  # [phone]. Olivia Wu's is # [id number].

## 2021-12-15 NOTE — Telephone Encounter (Signed)
Left VM for patient to return call, but then called mom since I had already talked to her today. She said patient is still sleeping. Mom/patient will advise if BP continue to remain elevated off of prednisone. Patient does not have any tobacco use.

## 2021-12-15 NOTE — Telephone Encounter (Signed)
See phone message from mom. Talked to mom and patient was seen earlier this week in MD's office and BP was 160/116. They are monitoring at home and it has been ranging 134-144/90-100. Patient is also on a lot of medications for eczema - Zyrtec, Benadryl, and has been off and on prednisone. Mom and patient are trying to determine what medications may be causing the BP elevation.

## 2022-01-06 ENCOUNTER — Other Ambulatory Visit: Payer: Self-pay | Admitting: Allergy and Immunology

## 2022-01-13 ENCOUNTER — Telehealth: Payer: Self-pay | Admitting: *Deleted

## 2022-01-13 NOTE — Telephone Encounter (Signed)
L/m for patient needs MD appt for Dupixent reapproval 

## 2022-01-17 ENCOUNTER — Telehealth: Payer: Self-pay | Admitting: *Deleted

## 2022-01-17 NOTE — Telephone Encounter (Signed)
L/m for patient to make MD appt for Dupixent reapproval

## 2022-01-27 ENCOUNTER — Ambulatory Visit: Payer: 59 | Admitting: Behavioral Health

## 2022-02-09 ENCOUNTER — Ambulatory Visit: Payer: 59 | Admitting: Behavioral Health

## 2022-02-15 ENCOUNTER — Ambulatory Visit: Payer: 59 | Admitting: Behavioral Health

## 2022-02-15 ENCOUNTER — Encounter: Payer: Self-pay | Admitting: Behavioral Health

## 2022-02-15 VITALS — BP 135/92 | HR 98 | Ht 67.0 in | Wt 205.0 lb

## 2022-02-15 DIAGNOSIS — F900 Attention-deficit hyperactivity disorder, predominantly inattentive type: Secondary | ICD-10-CM

## 2022-02-15 DIAGNOSIS — F3341 Major depressive disorder, recurrent, in partial remission: Secondary | ICD-10-CM | POA: Diagnosis not present

## 2022-02-15 DIAGNOSIS — F411 Generalized anxiety disorder: Secondary | ICD-10-CM

## 2022-02-15 NOTE — Progress Notes (Signed)
[facility]  Patient ID: Olivia Wu,  MRN: [id number]  PCP: Jarrett Soho, PA-C  Date of Evaluation: 02/15/2022 Time spent:30 minutes  Chief Complaint:  Chief Complaint   Anxiety; Depression; Follow-up; Medication Refill; Medication Problem; Patient Education     HISTORY/CURRENT STATUS: HPI  21 year old female presents to this office for initial visit and to establish care. She reports some persistent anxiety and depression. Says it still correlates with her cycle with severity. She was placed on Nikki by her OB/GYN but reports she feels symptoms are worse. The Wellbutrin has continued to assist her with attention and focus. She is very happy with her medication regimen now but believes her [facility] is affecting her moods and making it worse. Marland Kitchen Her sleep has improved and has not used the Trazodone often. 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  She says her anxiety is 410 and depression is 3/10 currently. She would like to get refills and commits to regular follow ups. Denies mania, no psychosis. No SI/HI.   No prior medication failures.    Individual Medical History/ Review of Systems: Changes? :No   Allergies: Augmentin [amoxicillin-pot clavulanate], Bactrim [sulfamethoxazole-trimethoprim], Cleocin [clindamycin hcl], Codeine, Keflex [cephalexin], and Sulfa antibiotics  Current Medications:  Current Outpatient Medications:    AUVI-Q 0.3 MG/0.3ML SOAJ injection, Use as directed for life-threatening allergic reaction. (Patient not taking: Reported on 07/25/2021), Disp: 1 each, Rfl: 3   BLISOVI 24 FE 1-20 MG-MCG(24) tablet, , Disp: , Rfl:    buPROPion (WELLBUTRIN XL) 300 MG 24 hr tablet, Take 1 tablet (300 mg total) by mouth daily., Disp: 90 tablet, Rfl: 2   cetirizine (ZYRTEC) 10 MG tablet, Take 10 mg by mouth daily. Take 1 by mouth every night at bedtime., Disp: , Rfl:    DUPIXENT 300 MG/2ML prefilled syringe, INJECT 300MG  SUBCUTANEOUSLY EVERY OTHER  WEEK, Disp: 4 mL, Rfl: 11   famotidine (PEPCID) 20 MG tablet, Take 20 mg by mouth 2 (two) times daily., Disp: , Rfl:    hydrOXYzine (ATARAX) 25 MG tablet, Take 1 tablet (25 mg total) by mouth 2 (two) times daily., Disp: 30 tablet, Rfl: 3   mometasone (ELOCON) 0.1 % ointment, Apply sparingly to red, itchy areas once or twice daily., Disp: 90 g, Rfl: 5   sertraline (ZOLOFT) 100 MG tablet, Take 1.5 tablets (150 mg total) by mouth daily., Disp: 45 tablet, Rfl: 3   traZODone (DESYREL) 50 MG tablet, Take 1 tablet (50 mg total) by mouth at bedtime., Disp: 30 tablet, Rfl: 3 Medication Side Effects: none  Family Medical/ Social History: Changes? No  MENTAL HEALTH EXAM:  There were no vitals taken for this visit.There is no height or weight on file to calculate BMI.  General Appearance: Casual, Neat, and Well Groomed  Eye Contact:  Good  Speech:  Clear and Coherent  Volume:  Normal  Mood:  Anxious  Affect:  Appropriate  Thought Process:  Coherent  Orientation:  Full (Time, Place, and Person)  Thought Content: Logical   Suicidal Thoughts:  No  Homicidal Thoughts:  No  Memory:  WNL  Judgement:  Good  Insight:  Good  Psychomotor Activity:  Normal  Concentration:  Concentration: Good  Recall:  Good  Fund of Knowledge: Good  Language: Good  Assets:  Desire for Improvement  ADL's:  Intact  Cognition: WNL  Prognosis:  Good    DIAGNOSES: No diagnosis found.  Receiving Psychotherapy: No    RECOMMENDATIONS:  Treatment Plan/Recommendations:  Greater than 50% of  30 min face to face time with patient was spent on counseling and coordination of care. We discussed her continued persistent anxiety and depression. Still struggling with some low level depression. She had concerns about becoming markedly more depressed since taking BC and she is having mood changes twice a month that aligns with [facility]. She was placed on [facility] and has take for two months. She believes her symptoms to be worse.  I instructed her to follow up with OB/Gyn to see if there are alternative BC.     We agreed to:  Increase Zoloft to 200 mg at bedtime Continue Wellbutrin 300 mg XL in the am Continue Trazodone 50 mg daily at bedtime Continue Hydroxyzine 25 mg twice daily   Will report any side effects or worsening symptoms To follow up in 4 weeks to reassess. Provided emergency contact information. Reviewed PDMP     Joan Flores, NP

## 2022-02-17 ENCOUNTER — Other Ambulatory Visit: Payer: Self-pay | Admitting: Behavioral Health

## 2022-02-17 DIAGNOSIS — F3341 Major depressive disorder, recurrent, in partial remission: Secondary | ICD-10-CM

## 2022-02-17 DIAGNOSIS — F411 Generalized anxiety disorder: Secondary | ICD-10-CM

## 2022-02-17 DIAGNOSIS — F900 Attention-deficit hyperactivity disorder, predominantly inattentive type: Secondary | ICD-10-CM

## 2022-03-15 ENCOUNTER — Encounter: Payer: Self-pay | Admitting: Behavioral Health

## 2022-03-15 ENCOUNTER — Ambulatory Visit: Payer: 59 | Admitting: Behavioral Health

## 2022-03-15 DIAGNOSIS — F3341 Major depressive disorder, recurrent, in partial remission: Secondary | ICD-10-CM | POA: Diagnosis not present

## 2022-03-15 DIAGNOSIS — F900 Attention-deficit hyperactivity disorder, predominantly inattentive type: Secondary | ICD-10-CM | POA: Diagnosis not present

## 2022-03-15 DIAGNOSIS — F411 Generalized anxiety disorder: Secondary | ICD-10-CM

## 2022-03-15 MED ORDER — SERTRALINE HCL 100 MG PO TABS: 150.0000 mg | ORAL_TABLET | Freq: Every day | ORAL | 1 refills | Status: DC

## 2022-03-15 MED ORDER — BUPROPION HCL ER (XL) 300 MG PO TB24: 300.0000 mg | ORAL_TABLET | Freq: Every day | ORAL | 1 refills | Status: DC

## 2022-03-15 MED ORDER — HYDROXYZINE HCL 25 MG PO TABS: 25.0000 mg | ORAL_TABLET | Freq: Two times a day (BID) | ORAL | 3 refills | Status: DC

## 2022-03-15 NOTE — Progress Notes (Signed)
[facility]  Patient ID: TAJANA MUNDLE,  MRN: [id number]  PCP: Marda Stalker, PA-C  Date of Evaluation: 03/15/2022 Time spent:30 minutes  Chief Complaint:  Chief Complaint   Anxiety; Depression; Follow-up; Medication Refill; Patient Education     HISTORY/CURRENT STATUS: HPI 21 year old female presents to this office for initial visit and to establish care. She says she is feeling much better since increasing her Zoloft. Feels like it is controlling her depression and anxiety much better. She doe not want to change her medications at this time. She currently has exacerbation of Eczema over her entire body. To follow up with derm.  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  She says her anxiety is 2/10 and depression is 2/10 currently. She would like to get refills and commits to regular follow ups. Denies mania, no psychosis. No SI/HI.   No prior medication failures.    Individual Medical History/ Review of Systems: Changes? :No   Allergies: Augmentin [amoxicillin-pot clavulanate], Bactrim [sulfamethoxazole-trimethoprim], Cleocin [clindamycin hcl], Codeine, Keflex [cephalexin], and Sulfa antibiotics  Current Medications:  Current Outpatient Medications:    AUVI-Q 0.3 MG/0.3ML SOAJ injection, Use as directed for life-threatening allergic reaction. (Patient not taking: Reported on 07/25/2021), Disp: 1 each, Rfl: 3   BLISOVI 24 FE 1-20 MG-MCG(24) tablet, , Disp: , Rfl:    buPROPion (WELLBUTRIN XL) 300 MG 24 hr tablet, Take 1 tablet (300 mg total) by mouth daily., Disp: 90 tablet, Rfl: 1   cetirizine (ZYRTEC) 10 MG tablet, Take 10 mg by mouth daily. Take 1 by mouth every night at bedtime., Disp: , Rfl:    DUPIXENT 300 MG/2ML prefilled syringe, INJECT 300MG  SUBCUTANEOUSLY EVERY OTHER WEEK, Disp: 4 mL, Rfl: 11   famotidine (PEPCID) 20 MG tablet, Take 20 mg by mouth 2 (two) times daily., Disp: , Rfl:    hydrOXYzine (ATARAX) 25 MG tablet, Take 1 tablet (25  mg total) by mouth 2 (two) times daily., Disp: 90 tablet, Rfl: 3   mometasone (ELOCON) 0.1 % ointment, Apply sparingly to red, itchy areas once or twice daily., Disp: 90 g, Rfl: 5   sertraline (ZOLOFT) 100 MG tablet, Take 1.5 tablets (150 mg total) by mouth daily., Disp: 135 tablet, Rfl: 1   traZODone (DESYREL) 50 MG tablet, Take 1 tablet (50 mg total) by mouth at bedtime., Disp: 30 tablet, Rfl: 3 Medication Side Effects: none  Family Medical/ Social History: Changes? No  MENTAL HEALTH EXAM:  There were no vitals taken for this visit.There is no height or weight on file to calculate BMI.  General Appearance: Casual, Neat, and Well Groomed  Eye Contact:  Good  Speech:  Clear and Coherent  Volume:  Normal  Mood:  NA  Affect:  Appropriate  Thought Process:  Coherent  Orientation:  Full (Time, Place, and Person)  Thought Content: Logical   Suicidal Thoughts:  No  Homicidal Thoughts:  No  Memory:  WNL  Judgement:  Good  Insight:  Good  Psychomotor Activity:  Normal  Concentration:  Concentration: Good  Recall:  Good  Fund of Knowledge: Good  Language: Good  Assets:  Desire for Improvement  ADL's:  Intact  Cognition: WNL  Prognosis:  Good    DIAGNOSES:    ICD-10-CM   1. Generalized anxiety disorder  F41.1 hydrOXYzine (ATARAX) 25 MG tablet    buPROPion (WELLBUTRIN XL) 300 MG 24 hr tablet    sertraline (ZOLOFT) 100 MG tablet    2. Attention deficit hyperactivity  disorder (ADHD), predominantly inattentive type  F90.0 buPROPion (WELLBUTRIN XL) 300 MG 24 hr tablet    sertraline (ZOLOFT) 100 MG tablet    3. Recurrent major depressive disorder, in partial remission (HCC)  F33.41 buPROPion (WELLBUTRIN XL) 300 MG 24 hr tablet    sertraline (ZOLOFT) 100 MG tablet      Receiving Psychotherapy: No    RECOMMENDATIONS:     Treatment Plan/Recommendations:   Greater than 50% of  30 min face to face time with patient was spent on counseling and coordination of care. She has  significantly improved since last visit. Her depression has subsided since increasing the dose of her zoloft. She is having severe Exzema exacerbation currently and is constantly scratching her skin all over. She has appointment with derm and is interested in trying new medication.    We agreed to:   Continue Zoloft to 200 mg at bedtime Continue Wellbutrin 300 mg XL in the am Continue Trazodone 50 mg daily at bedtime Continue Hydroxyzine 25 mg three time daily   Will report any side effects or worsening symptoms To follow up in 4 weeks to reassess. Provided emergency contact information. Reviewed PDMP    Elwanda Brooklyn, NP

## 2022-03-28 ENCOUNTER — Telehealth: Payer: Self-pay | Admitting: Behavioral Health

## 2022-03-28 NOTE — Telephone Encounter (Signed)
Pt called reporting pharmacy has been filling Hydroxyzine wrong. Filling #30. S/B #90. Pharmacy sending PA request for #90.

## 2022-03-29 ENCOUNTER — Telehealth: Payer: Self-pay | Admitting: Allergy and Immunology

## 2022-03-29 MED ORDER — AUVI-Q 0.3 MG/0.3ML IJ SOAJ: INTRAMUSCULAR | 3 refills | Status: DC

## 2022-03-29 NOTE — Telephone Encounter (Signed)
LVM to RC.  90 tabs is a 45-day supply, ? If she means 90 tabs of a 90-day supply. PA request in CMM is showing 45-day supply.

## 2022-03-29 NOTE — Telephone Encounter (Signed)
Patient is needing a refill on Auvi Q.

## 2022-03-29 NOTE — Telephone Encounter (Signed)
Refill for Olivia Wu has been sent to [facility]

## 2022-03-30 ENCOUNTER — Telehealth: Payer: Self-pay

## 2022-03-30 NOTE — Telephone Encounter (Signed)
Called pharmacy. Patient paid cash price.

## 2022-03-30 NOTE — Telephone Encounter (Signed)
Left another VM to RC.  

## 2022-04-01 ENCOUNTER — Other Ambulatory Visit: Payer: Self-pay | Admitting: Allergy and Immunology

## 2022-04-06 ENCOUNTER — Other Ambulatory Visit: Payer: Self-pay

## 2022-04-06 DIAGNOSIS — F411 Generalized anxiety disorder: Secondary | ICD-10-CM

## 2022-04-06 MED ORDER — HYDROXYZINE HCL 25 MG PO TABS: 25.0000 mg | ORAL_TABLET | Freq: Three times a day (TID) | ORAL | 3 refills | Status: DC

## 2022-04-07 NOTE — Telephone Encounter (Signed)
Prior Authorization   On plan. PA not needed.

## 2022-04-28 ENCOUNTER — Other Ambulatory Visit: Payer: Self-pay | Admitting: Behavioral Health

## 2022-04-28 DIAGNOSIS — F411 Generalized anxiety disorder: Secondary | ICD-10-CM

## 2022-05-03 ENCOUNTER — Ambulatory Visit: Payer: Self-pay | Admitting: Allergy and Immunology

## 2022-05-15 ENCOUNTER — Encounter: Payer: Self-pay | Admitting: Allergy and Immunology

## 2022-05-15 ENCOUNTER — Ambulatory Visit (INDEPENDENT_AMBULATORY_CARE_PROVIDER_SITE_OTHER): Payer: 59 | Admitting: Allergy and Immunology

## 2022-05-15 VITALS — BP 118/82 | HR 80 | Resp 16 | Ht 67.0 in | Wt 214.0 lb

## 2022-05-15 DIAGNOSIS — J4530 Mild persistent asthma, uncomplicated: Secondary | ICD-10-CM

## 2022-05-15 DIAGNOSIS — T781XXD Other adverse food reactions, not elsewhere classified, subsequent encounter: Secondary | ICD-10-CM

## 2022-05-15 DIAGNOSIS — L2089 Other atopic dermatitis: Secondary | ICD-10-CM

## 2022-05-15 DIAGNOSIS — J3089 Other allergic rhinitis: Secondary | ICD-10-CM | POA: Diagnosis not present

## 2022-05-15 MED ORDER — AUVI-Q 0.3 MG/0.3ML IJ SOAJ: INTRAMUSCULAR | 3 refills | Status: AC

## 2022-05-15 NOTE — Progress Notes (Unsigned)
[place]   Follow-up Note  Referring Provider: Marda Stalker, PA-C Primary Provider: Marda Stalker, PA-C Date of Office Visit: 05/15/2022  Subjective:   Olivia Wu (DOB: July 27, 2000) is a 21 y.o. female who returns to the [facility] on 05/15/2022 in re-evaluation of the following:  HPI: Olivia Wu returns to this clinic in evaluation of atopic dermatitis, asthma, allergic rhinoconjunctivitis, presumed eosinophilic esophagitis, history of allergic urticaria, history of food allergy directed against dairy, egg, and peanut.  She has been off her dupilumab for the past 8 months and her skin is really flared quite bad.  Apparently she stopped her dupilumab because it was giving rise to diffuse itchiness and possibly hives and also because she saw a holistic doctor who told her that she had parasites and was treating her with a bunch of supplements.  Fortunately, she visited with dermatology who started her on Olivia Wu and within a week she saw dramatic improvement.  She is on her third week of this medication has a return appointment to see dermatology for follow-up labs.  She has had no problems with her airway and rarely uses a short acting bronchodilator.  She has had no problems with reflux.  She remains away from eating dairy and egg and peanut.  When she eats dairy she definitely develops a big flare of her skin.  She sees a similar type of problem with egg although not as bad as with dairy.  She develops systemic symptoms when consuming peanut.  She has also noticed that she has gotten a little bit itchy when she eats various fruits and red dye recently.  She is not sure if this is better now that she is using her Jak inhibitor.  Allergies as of 05/15/2022       Reactions   Augmentin [amoxicillin-pot Clavulanate] Nausea And Vomiting   Bactrim [sulfamethoxazole-trimethoprim] Hives   Cleocin [clindamycin Hcl] Hives    Codeine    Keflex [cephalexin] Hives   Sulfa Antibiotics         Medication List    Auvi-Q 0.3 mg/0.3 mL Soaj injection Generic drug: EPINEPHrine Use as directed for life-threatening allergic reaction.   Blisovi 24 Fe 1-20 MG-MCG(24) tablet Generic drug: Norethindrone Acetate-Ethinyl Estrad-FE   buPROPion 300 MG 24 hr tablet Commonly known as: WELLBUTRIN XL Take 1 tablet (300 mg total) by mouth daily.   cetirizine 10 MG tablet Commonly known as: ZYRTEC Take 10 mg by mouth daily. Take 1 by mouth every night at bedtime.   Dupixent 300 MG/2ML prefilled syringe Generic drug: dupilumab INJECT 300MG  SUBCUTANEOUSLY EVERY OTHER WEEK   famotidine 20 MG tablet Commonly known as: PEPCID Take 20 mg by mouth 2 (two) times daily.   hydrOXYzine 25 MG tablet Commonly known as: ATARAX TAKE 1 TABLET BY MOUTH THREE TIMES A DAY   mometasone 0.1 % ointment Commonly known as: ELOCON Apply sparingly to red, itchy areas once or twice daily.   sertraline 100 MG tablet Commonly known as: ZOLOFT Take 1.5 tablets (150 mg total) by mouth daily.   traZODone 50 MG tablet Commonly known as: DESYREL Take 1 tablet (50 mg total) by mouth at bedtime.        Past Medical History:  Diagnosis Date   Asthma    Eczema    Headache    Ulnar neuropathy at elbow, left 06/28/2020   Urticaria     History reviewed. No pertinent surgical history.  Review  of systems negative except as noted in HPI / PMHx or noted below:  Review of Systems  Constitutional: Negative.   HENT: Negative.    Eyes: Negative.   Respiratory: Negative.    Cardiovascular: Negative.   Gastrointestinal: Negative.   Genitourinary: Negative.   Musculoskeletal: Negative.   Skin: Negative.   Neurological: Negative.   Endo/Heme/Allergies: Negative.   Psychiatric/Behavioral: Negative.       Objective:   Vitals:   05/15/22 1649  BP: 118/82  Pulse: 80  Resp: 16  SpO2: 99%   Height: 5\' 7"  (170.2 cm)  Weight: 214  lb (97.1 kg)   Physical Exam Constitutional:      Appearance: She is not diaphoretic.  HENT:     Head: Normocephalic.     Right Ear: Tympanic membrane, ear canal and external ear normal.     Left Ear: Tympanic membrane, ear canal and external ear normal.     Nose: Nose normal. No mucosal edema or rhinorrhea.     Mouth/Throat:     Pharynx: Uvula midline. No oropharyngeal exudate.  Eyes:     Conjunctiva/sclera: Conjunctivae normal.  Neck:     Thyroid: No thyromegaly.     Trachea: Trachea normal. No tracheal tenderness or tracheal deviation.  Cardiovascular:     Rate and Rhythm: Normal rate and regular rhythm.     Heart sounds: Normal heart sounds, S1 normal and S2 normal. No murmur heard. Pulmonary:     Effort: No respiratory distress.     Breath sounds: Normal breath sounds. No stridor. No wheezing or rales.  Lymphadenopathy:     Head:     Right side of head: No tonsillar adenopathy.     Left side of head: No tonsillar adenopathy.     Cervical: No cervical adenopathy.  Skin:    Findings: No erythema or rash.     Nails: There is no clubbing.  Neurological:     Mental Status: She is alert.     Diagnostics:   Results of blood tests obtained 12 January 2021 identifies peanut IgE 1.82 KU/L, Ara H21.20 KU/L, Ara H60.66 KU/L, negative IgE antibodies directed against tree nuts, milk components, egg components  Assessment and Plan:   1. Asthma, well controlled, mild persistent   2. Other allergic rhinitis   3. Other atopic dermatitis   4. Adverse food reaction, subsequent encounter     1. Continue Rinvoq as per dermatology  2. Continue food elimination diet - dairy, egg, peanut  3. Continue Zyrtec 10 mg - 1 tablet 1-2 times per day   4. Continue Pepcid 20 mg - 1 tablet 1-2 times per day  5. If needed:   A. Proventil HFA   B. Auvi-Q 0.3 / Epi-Pen  C. mometasone 0.1% ointment applied 1-2 times a day   D. Flonase 1-2 sprays each nostril once a day    6.  Check blood  with next dermatology appointment: Carmine red IgE  7.  Return to clinic in 6 months or earlier if problem  Olivia Wu should do very well with while using her Jak 2 inhibitor regarding her multiorgan atopic disease.  Certainly this is going to help her atopic dermatitis and definitely her airway issue and may actually help some of her food hypersensitivity.  She does have a history of developing sensitivities are directed against foods that are red and candies that have read and we will check a carman rate IgE to work through this issue during her next dermatologic appointment when  she is going to have follow-up labs for Jak 2 inhibitor use.   Allena Katz, MD Allergy / Immunology [place]

## 2022-05-15 NOTE — Patient Instructions (Addendum)
  1. Continue Rinvoq as per dermatology  2. Continue food elimination diet - dairy, egg, peanut  3. Continue Zyrtec 10 mg - 1 tablet 1-2 times per day   4. Continue Pepcid 20 mg - 1 tablet 1-2 times per day  5. If needed:   A. Proventil HFA   B. Auvi-Q 0.3 / Epi-Pen  C. mometasone 0.1% ointment applied 1-2 times a day   D. Flonase 1-2 sprays each nostril once a day    6.  Check blood with next dermatology appointment: Carmine red IgE  7.  Return to clinic in 6 months or earlier if problem

## 2022-05-16 ENCOUNTER — Encounter: Payer: Self-pay | Admitting: Allergy and Immunology

## 2022-05-23 ENCOUNTER — Ambulatory Visit: Payer: 59 | Admitting: Behavioral Health

## 2022-05-23 ENCOUNTER — Encounter: Payer: Self-pay | Admitting: Behavioral Health

## 2022-05-23 VITALS — Wt 190.0 lb

## 2022-05-23 DIAGNOSIS — F900 Attention-deficit hyperactivity disorder, predominantly inattentive type: Secondary | ICD-10-CM | POA: Diagnosis not present

## 2022-05-23 DIAGNOSIS — F99 Mental disorder, not otherwise specified: Secondary | ICD-10-CM

## 2022-05-23 DIAGNOSIS — F411 Generalized anxiety disorder: Secondary | ICD-10-CM | POA: Diagnosis not present

## 2022-05-23 DIAGNOSIS — F3341 Major depressive disorder, recurrent, in partial remission: Secondary | ICD-10-CM | POA: Diagnosis not present

## 2022-05-23 DIAGNOSIS — F5105 Insomnia due to other mental disorder: Secondary | ICD-10-CM | POA: Diagnosis not present

## 2022-05-23 MED ORDER — TRAZODONE HCL 100 MG PO TABS: 100.0000 mg | ORAL_TABLET | Freq: Every day | ORAL | 1 refills | Status: DC

## 2022-05-23 MED ORDER — BUPROPION HCL ER (XL) 300 MG PO TB24: 300.0000 mg | ORAL_TABLET | Freq: Every day | ORAL | 1 refills | Status: DC

## 2022-05-23 MED ORDER — SERTRALINE HCL 100 MG PO TABS: 150.0000 mg | ORAL_TABLET | Freq: Every day | ORAL | 1 refills | Status: DC

## 2022-05-23 NOTE — Progress Notes (Signed)
[facility]  Patient ID: Olivia Wu,  MRN: [id number]  PCP: Jarrett Soho, PA-C  Date of Evaluation: 05/23/2022 Time spent:30 minutes  Chief Complaint:  Chief Complaint   Anxiety; Depression; Medication Problem; Medication Refill; Patient Education; Follow-up; Insomnia     HISTORY/CURRENT STATUS: HPI 21 year old female presents to this office for initial visit and to establish care. She says she continue to feel pretty good but her change in [facility] has increased her anxiety. Says she is very sensitive and it effects her sleep.  She made the change because her periods were too frequent and experiencing irregularity. She is requesting medication adjustment with her Trazodone to help with sleep.  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  She says her anxiety is 3/10 and depression is 2/10 currently. She is sleeping about 6 hours per night right now and says it is not enough. She would like to get refills and commits to regular follow ups. Denies mania, no psychosis. No SI/HI.   No prior medication failures.     Individual Medical History/ Review of Systems: Changes? :No   Allergies: Augmentin [amoxicillin-pot clavulanate], Bactrim [sulfamethoxazole-trimethoprim], Cleocin [clindamycin hcl], Codeine, Keflex [cephalexin], and Sulfa antibiotics  Current Medications:  Current Outpatient Medications:    traZODone (DESYREL) 100 MG tablet, Take 1 tablet (100 mg total) by mouth at bedtime., Disp: 90 tablet, Rfl: 1   AUVI-Q 0.3 MG/0.3ML SOAJ injection, Use as directed for life-threatening allergic reaction., Disp: 1 each, Rfl: 3   BLISOVI 24 FE 1-20 MG-MCG(24) tablet, , Disp: , Rfl:    buPROPion (WELLBUTRIN XL) 300 MG 24 hr tablet, Take 1 tablet (300 mg total) by mouth daily., Disp: 90 tablet, Rfl: 1   cetirizine (ZYRTEC) 10 MG tablet, Take 10 mg by mouth daily. Take 1 by mouth every night at bedtime., Disp: , Rfl:    DUPIXENT 300 MG/2ML prefilled syringe,  INJECT 300MG  SUBCUTANEOUSLY EVERY OTHER WEEK, Disp: 4 mL, Rfl: 11   famotidine (PEPCID) 20 MG tablet, Take 20 mg by mouth 2 (two) times daily., Disp: , Rfl:    hydrOXYzine (ATARAX) 25 MG tablet, TAKE 1 TABLET BY MOUTH THREE TIMES A DAY, Disp: 270 tablet, Rfl: 1   mometasone (ELOCON) 0.1 % ointment, Apply sparingly to red, itchy areas once or twice daily., Disp: 90 g, Rfl: 5   sertraline (ZOLOFT) 100 MG tablet, Take 1.5 tablets (150 mg total) by mouth daily., Disp: 135 tablet, Rfl: 1   traZODone (DESYREL) 50 MG tablet, Take 1 tablet (50 mg total) by mouth at bedtime., Disp: 30 tablet, Rfl: 3 Medication Side Effects: none  Family Medical/ Social History: Changes? No  MENTAL HEALTH EXAM:  There were no vitals taken for this visit.There is no height or weight on file to calculate BMI.  General Appearance: Casual and Neat  Eye Contact:  Good  Speech:  Clear and Coherent  Volume:  Normal  Mood:  Anxious  Affect:  Appropriate  Thought Process:  Coherent  Orientation:  Full (Time, Place, and Person)  Thought Content: Logical   Suicidal Thoughts:  No  Homicidal Thoughts:  No  Memory:  WNL  Judgement:  Good  Insight:  Good  Psychomotor Activity:  Normal  Concentration:  Concentration: Good  Recall:  Good  Fund of Knowledge: Good  Language: Good  Assets:  Desire for Improvement  ADL's:  Intact  Cognition: WNL  Prognosis:  Good    DIAGNOSES:    ICD-10-CM   1.  Insomnia due to other mental disorder  F51.05 traZODone (DESYREL) 100 MG tablet   F99     2. Generalized anxiety disorder  F41.1 sertraline (ZOLOFT) 100 MG tablet    buPROPion (WELLBUTRIN XL) 300 MG 24 hr tablet    traZODone (DESYREL) 100 MG tablet    3. Attention deficit hyperactivity disorder (ADHD), predominantly inattentive type  F90.0 sertraline (ZOLOFT) 100 MG tablet    buPROPion (WELLBUTRIN XL) 300 MG 24 hr tablet    4. Recurrent major depressive disorder, in partial remission (HCC)  F33.41 sertraline (ZOLOFT) 100 MG  tablet    buPROPion (WELLBUTRIN XL) 300 MG 24 hr tablet      Receiving Psychotherapy: No    RECOMMENDATIONS:  Greater than 50% of  30 min face to face time with patient was spent on counseling and coordination of care. She has significantly improved since last visit. Her depression has subsided since increasing the dose of her zoloft. She is having severe Exzema exacerbation currently and is constantly scratching her skin all over. She has appointment with derm and is interested in trying new medication.    We agreed to:   Continue Zoloft to 150 mg at bedtime Continue Wellbutrin 300 mg XL in the am Increase Trazodone to 100 mg daily at bedtime Continue Hydroxyzine 25 mg three time daily   Will report any side effects or worsening symptoms To follow up in 6 weeks to reassess. Provided emergency contact information. Reviewed PDMP       Joan Flores, NP

## 2022-07-24 ENCOUNTER — Encounter: Payer: Self-pay | Admitting: Behavioral Health

## 2022-07-24 ENCOUNTER — Ambulatory Visit: Payer: 59 | Admitting: Behavioral Health

## 2022-07-24 DIAGNOSIS — F900 Attention-deficit hyperactivity disorder, predominantly inattentive type: Secondary | ICD-10-CM

## 2022-07-24 DIAGNOSIS — F3341 Major depressive disorder, recurrent, in partial remission: Secondary | ICD-10-CM | POA: Diagnosis not present

## 2022-07-24 DIAGNOSIS — F5105 Insomnia due to other mental disorder: Secondary | ICD-10-CM | POA: Diagnosis not present

## 2022-07-24 DIAGNOSIS — F411 Generalized anxiety disorder: Secondary | ICD-10-CM

## 2022-07-24 DIAGNOSIS — F99 Mental disorder, not otherwise specified: Secondary | ICD-10-CM

## 2022-07-24 MED ORDER — BUPROPION HCL ER (XL) 300 MG PO TB24: 300.0000 mg | ORAL_TABLET | Freq: Every day | ORAL | 1 refills | Status: DC

## 2022-07-24 MED ORDER — TRAZODONE HCL 100 MG PO TABS: 100.0000 mg | ORAL_TABLET | Freq: Every day | ORAL | 1 refills | Status: DC

## 2022-07-24 MED ORDER — HYDROXYZINE HCL 25 MG PO TABS: 25.0000 mg | ORAL_TABLET | Freq: Four times a day (QID) | ORAL | 0 refills | Status: DC | PRN

## 2022-07-24 MED ORDER — SERTRALINE HCL 100 MG PO TABS: 150.0000 mg | ORAL_TABLET | Freq: Every day | ORAL | 1 refills | Status: DC

## 2022-07-24 NOTE — Progress Notes (Signed)
[facility]  Patient ID: TARIAH TRANSUE,  MRN: [id number]  PCP: Marda Stalker, PA-C  Date of Evaluation: 07/24/2022 Time spent:30 minutes  Chief Complaint:  Chief Complaint   Anxiety; Depression; Follow-up; Patient Education; Medication Refill; Stress     HISTORY/CURRENT STATUS: HPI+  22 year old female presents to this office for follow up and medication management.  Says she is doing well mentally but has been dealing with a very severe eczema flair affecting her entire body. She is in final stages of her skin starting to heal. She tries to remain positive and would like to keep her medications the same. She is requesting small increase with her hydroxyzine.  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  She says her anxiety is 4/10 and depression is 2/10 currently. She is sleeping about 6 hours per night right now and says it is not enough. She would like to get refills and commits to regular follow ups. Denies mania, no psychosis. No SI/HI.   No prior medication failures.       Individual Medical History/ Review of Systems: Changes? :No   Allergies: Augmentin [amoxicillin-pot clavulanate], Bactrim [sulfamethoxazole-trimethoprim], Cleocin [clindamycin hcl], Codeine, Keflex [cephalexin], and Sulfa antibiotics  Current Medications:  Current Outpatient Medications:    AUVI-Q 0.3 MG/0.3ML SOAJ injection, Use as directed for life-threatening allergic reaction., Disp: 1 each, Rfl: 3   BLISOVI 24 FE 1-20 MG-MCG(24) tablet, , Disp: , Rfl:    buPROPion (WELLBUTRIN XL) 300 MG 24 hr tablet, Take 1 tablet (300 mg total) by mouth daily., Disp: 90 tablet, Rfl: 1   cetirizine (ZYRTEC) 10 MG tablet, Take 10 mg by mouth daily. Take 1 by mouth every night at bedtime., Disp: , Rfl:    DUPIXENT 300 MG/2ML prefilled syringe, INJECT 300MG  SUBCUTANEOUSLY EVERY OTHER WEEK, Disp: 4 mL, Rfl: 11   famotidine (PEPCID) 20 MG tablet, Take 20 mg by mouth 2 (two) times daily.,  Disp: , Rfl:    hydrOXYzine (ATARAX) 25 MG tablet, Take 1 tablet (25 mg total) by mouth every 6 (six) hours as needed., Disp: 360 tablet, Rfl: 0   mometasone (ELOCON) 0.1 % ointment, Apply sparingly to red, itchy areas once or twice daily., Disp: 90 g, Rfl: 5   sertraline (ZOLOFT) 100 MG tablet, Take 1.5 tablets (150 mg total) by mouth daily., Disp: 135 tablet, Rfl: 1   traZODone (DESYREL) 100 MG tablet, Take 1 tablet (100 mg total) by mouth at bedtime., Disp: 90 tablet, Rfl: 1   traZODone (DESYREL) 50 MG tablet, Take 1 tablet (50 mg total) by mouth at bedtime., Disp: 30 tablet, Rfl: 3 Medication Side Effects: none  Family Medical/ Social History: Changes? No  MENTAL HEALTH EXAM:  There were no vitals taken for this visit.There is no height or weight on file to calculate BMI.  General Appearance: Casual, Neat, and Well Groomed  Eye Contact:  Good  Speech:  Clear and Coherent  Volume:  Normal  Mood:  NA  Affect:  Appropriate  Thought Process:  Coherent  Orientation:  Full (Time, Place, and Person)  Thought Content: Logical   Suicidal Thoughts:  No  Homicidal Thoughts:  No  Memory:  WNL  Judgement:  Good  Insight:  Good  Psychomotor Activity:  Normal  Concentration:  Concentration: Good  Recall:  Good  Fund of Knowledge: Good  Language: Good  Assets:  Desire for Improvement  ADL's:  Intact  Cognition: WNL  Prognosis:  Good  DIAGNOSES:    ICD-10-CM   1. Generalized anxiety disorder  F41.1 buPROPion (WELLBUTRIN XL) 300 MG 24 hr tablet    traZODone (DESYREL) 100 MG tablet    sertraline (ZOLOFT) 100 MG tablet    hydrOXYzine (ATARAX) 25 MG tablet    2. Attention deficit hyperactivity disorder (ADHD), predominantly inattentive type  F90.0 buPROPion (WELLBUTRIN XL) 300 MG 24 hr tablet    sertraline (ZOLOFT) 100 MG tablet    3. Recurrent major depressive disorder, in partial remission (HCC)  F33.41 buPROPion (WELLBUTRIN XL) 300 MG 24 hr tablet    sertraline (ZOLOFT) 100 MG  tablet    4. Insomnia due to other mental disorder  F51.05 traZODone (DESYREL) 100 MG tablet   F99       Receiving Psychotherapy: No    RECOMMENDATIONS:   Greater than 50% of  30 min face to face time with patient was spent on counseling and coordination of care. She has significantly improved since last visit. Her depression has subsided since increasing the dose of her zoloft. She is having severe Exzema exacerbation currently and now is on a new medication. She does not want to change any of her current psychiatric medication. Reviewed medication list with her again today.     We agreed to:   Continue Zoloft to 150 mg at bedtime Continue Wellbutrin 300 mg XL in the am Increase Trazodone to 100 mg daily at bedtime Increase Hydroxyzine 25 mg four times daily   Will report any side effects or worsening symptoms To follow up in 3 months to reassess. Provided emergency contact information. Reviewed PDMP     Elwanda Brooklyn, NP

## 2022-08-07 ENCOUNTER — Telehealth: Payer: Self-pay | Admitting: Allergy and Immunology

## 2022-08-07 ENCOUNTER — Telehealth: Payer: Self-pay

## 2022-08-07 NOTE — Telephone Encounter (Signed)
Olivia Chimes, Pa/Dermatology [phone] - called in - DOB verified - requesting information regarding patient stopping Dupixent.  Olivia Wu was advised a medical release form will need to be completed/signed by patient authorizing our office to release medical information.  Olivia Wu our fax number to fax medical release form.  Olivia Wu verbalized understanding, no further questions.

## 2022-08-07 NOTE — Telephone Encounter (Signed)
Mom called requesting a call back from a nurse or Dr. Neldon Mc. She mentioned patients Dermatologist, Marella Chimes (Dermatology Specialist - [facility]) wants to start her on [place] and she has a couple of questions/concerns.

## 2022-08-07 NOTE — Telephone Encounter (Signed)
Marella Chimes, PA-C (Dermatology) would like to speak with you about starting patient on Adbry.   Call back number is [phone]

## 2022-08-10 NOTE — Telephone Encounter (Signed)
Marella Chimes, PA-C (Dermatology) called to speak to Dr. Neldon Mc and wanted to get more insight on the patient's allergies and issues with dupixent before starting the Adbry injections.    Patient and her mother had concerns about the potential reactions listed for the Adbry injection.  Due to her having issues with Dupixent the patient and her mother have concerns regarding possible reactions.    Marella Chimes, PA-C (Dermatology) wants Dr. Bruna Potter approval of Adbry injection since he is the person that knows what this patient is allergic to.

## 2022-08-14 NOTE — Telephone Encounter (Signed)
Patient made an appointment for 08/28/22 @1$ :30pm with Dr. Neldon Mc to go over [place] concerns.

## 2022-08-16 NOTE — Telephone Encounter (Signed)
Mom called and states they would rather discuss Adbry issue through the phone instead of waiting until appointment.   Left voicemail for Marella Chimes PA-C  Dermatologist to return our call.

## 2022-08-21 NOTE — Telephone Encounter (Signed)
Olivia Wu's mom was informed last week and verbalized understanding.

## 2022-08-28 ENCOUNTER — Ambulatory Visit: Payer: 59 | Admitting: Allergy and Immunology

## 2022-10-23 ENCOUNTER — Ambulatory Visit (INDEPENDENT_AMBULATORY_CARE_PROVIDER_SITE_OTHER): Payer: 59 | Admitting: Behavioral Health

## 2022-10-23 ENCOUNTER — Encounter: Payer: Self-pay | Admitting: Behavioral Health

## 2022-10-23 VITALS — Wt 192.0 lb

## 2022-10-23 DIAGNOSIS — F5105 Insomnia due to other mental disorder: Secondary | ICD-10-CM

## 2022-10-23 DIAGNOSIS — F411 Generalized anxiety disorder: Secondary | ICD-10-CM | POA: Diagnosis not present

## 2022-10-23 DIAGNOSIS — F3341 Major depressive disorder, recurrent, in partial remission: Secondary | ICD-10-CM

## 2022-10-23 DIAGNOSIS — F900 Attention-deficit hyperactivity disorder, predominantly inattentive type: Secondary | ICD-10-CM | POA: Diagnosis not present

## 2022-10-23 DIAGNOSIS — F99 Mental disorder, not otherwise specified: Secondary | ICD-10-CM

## 2022-10-23 MED ORDER — ESZOPICLONE 2 MG PO TABS: 2.0000 mg | ORAL_TABLET | Freq: Every evening | ORAL | 1 refills | Status: DC | PRN

## 2022-10-23 MED ORDER — TRAZODONE HCL 150 MG PO TABS: 150.0000 mg | ORAL_TABLET | Freq: Every day | ORAL | 1 refills | Status: DC

## 2022-10-23 MED ORDER — HYDROXYZINE HCL 50 MG PO TABS: 50.0000 mg | ORAL_TABLET | Freq: Three times a day (TID) | ORAL | 1 refills | Status: DC | PRN

## 2022-10-23 NOTE — Progress Notes (Signed)
[facility]  Patient ID: Olivia Wu,  MRN: [id number]  PCP: Jarrett Soho, PA-C  Date of Evaluation: 10/23/2022 Time spent:30 minutes  Chief Complaint:  Chief Complaint   Anxiety; Depression; Follow-up; Medication Refill; Patient Education     HISTORY/CURRENT STATUS: HPI 22 year old female presents to this office for follow up and medication management.  Says she is doing well mentally but has been dealing with a very severe eczema flair affecting her entire body including full face.  Face is completely covered with flaking skin and healing scabs.  She tries to remain positive and would like to keep her medications the same. She is requesting small increase with her hydroxyzine.  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  She says her anxiety is 5/10 and depression is 3/10 currently. Sleeping 6 hours or less per night due to itching.  She would like to get refills and commits to regular follow ups. Denies mania, no psychosis. No SI/HI.   No prior medication failures.       Individual Medical History/ Review of Systems: Changes? :No   Allergies: Augmentin [amoxicillin-pot clavulanate], Bactrim [sulfamethoxazole-trimethoprim], Cleocin [clindamycin hcl], Codeine, Keflex [cephalexin], and Sulfa antibiotics  Current Medications:  Current Outpatient Medications:    eszopiclone (LUNESTA) 2 MG TABS tablet, Take 1 tablet (2 mg total) by mouth at bedtime as needed for sleep. Take immediately before bedtime, Disp: 30 tablet, Rfl: 1   hydrOXYzine (ATARAX) 50 MG tablet, Take 1 tablet (50 mg total) by mouth 3 (three) times daily as needed., Disp: 180 tablet, Rfl: 1   traZODone (DESYREL) 150 MG tablet, Take 1 tablet (150 mg total) by mouth at bedtime., Disp: 90 tablet, Rfl: 1   AUVI-Q 0.3 MG/0.3ML SOAJ injection, Use as directed for life-threatening allergic reaction., Disp: 1 each, Rfl: 3   BLISOVI 24 FE 1-20 MG-MCG(24) tablet, , Disp: , Rfl:    buPROPion  (WELLBUTRIN XL) 300 MG 24 hr tablet, Take 1 tablet (300 mg total) by mouth daily., Disp: 90 tablet, Rfl: 1   cetirizine (ZYRTEC) 10 MG tablet, Take 10 mg by mouth daily. Take 1 by mouth every night at bedtime., Disp: , Rfl:    famotidine (PEPCID) 20 MG tablet, Take 20 mg by mouth 2 (two) times daily., Disp: , Rfl:    hydrOXYzine (ATARAX) 25 MG tablet, Take 1 tablet (25 mg total) by mouth every 6 (six) hours as needed., Disp: 360 tablet, Rfl: 0   mometasone (ELOCON) 0.1 % ointment, Apply sparingly to red, itchy areas once or twice daily., Disp: 90 g, Rfl: 5   sertraline (ZOLOFT) 100 MG tablet, Take 1.5 tablets (150 mg total) by mouth daily., Disp: 135 tablet, Rfl: 1   traZODone (DESYREL) 100 MG tablet, Take 1 tablet (100 mg total) by mouth at bedtime., Disp: 90 tablet, Rfl: 1   traZODone (DESYREL) 50 MG tablet, Take 1 tablet (50 mg total) by mouth at bedtime., Disp: 30 tablet, Rfl: 3 Medication Side Effects: none  Family Medical/ Social History: Changes? No  MENTAL HEALTH EXAM:  There were no vitals taken for this visit.There is no height or weight on file to calculate BMI.  General Appearance: Casual, Neat, and Well Groomed  Eye Contact:  Good  Speech:  Clear and Coherent  Volume:  Normal  Mood:  Anxious  Affect:  Congruent and Anxious  Thought Process:  Coherent  Orientation:  Full (Time, Place, and Person)  Thought Content: Logical   Suicidal Thoughts:  No  Homicidal Thoughts:  No  Memory:  WNL  Judgement:  Good  Insight:  Good  Psychomotor Activity:  Normal  Concentration:  Concentration: Good  Recall:  Good  Fund of Knowledge: Good  Language: Good  Assets:  Desire for Improvement  ADL's:  Intact  Cognition: WNL  Prognosis:  Good    DIAGNOSES:    ICD-10-CM   1. Generalized anxiety disorder  F41.1 hydrOXYzine (ATARAX) 50 MG tablet    2. Attention deficit hyperactivity disorder (ADHD), predominantly inattentive type  F90.0     3. Recurrent major depressive disorder, in  partial remission (HCC)  F33.41     4. Insomnia due to other mental disorder  F51.05 eszopiclone (LUNESTA) 2 MG TABS tablet   F99 hydrOXYzine (ATARAX) 50 MG tablet    traZODone (DESYREL) 150 MG tablet      Receiving Psychotherapy: No    RECOMMENDATIONS:   Greater than 50% of  30 min face to face time with patient was spent on counseling and coordination of care. She has significantly improved since last visit. Her depression has subsided since increasing the dose of her zoloft. She is having severe Exzema exacerbation currently and now is on a new medication. She does not want to change any of her current psychiatric medication. Reviewed medication list with her again today.     We agreed to:   Continue Zoloft to 150 mg at bedtime Continue Wellbutrin 300 mg XL in the am Increase Trazodone to 150 mg daily at bedtime Increase Hydroxyzine 50 mg twice daily   Will report any side effects or worsening symptoms To follow up in 6 weeks to reassess. Provided emergency contact information. Reviewed PDMP          Joan Flores, NP

## 2022-11-11 ENCOUNTER — Other Ambulatory Visit: Payer: Self-pay | Admitting: Behavioral Health

## 2022-11-11 DIAGNOSIS — F411 Generalized anxiety disorder: Secondary | ICD-10-CM

## 2022-12-04 ENCOUNTER — Encounter: Payer: Self-pay | Admitting: Behavioral Health

## 2022-12-04 ENCOUNTER — Ambulatory Visit: Payer: 59 | Admitting: Behavioral Health

## 2022-12-04 DIAGNOSIS — F3341 Major depressive disorder, recurrent, in partial remission: Secondary | ICD-10-CM | POA: Diagnosis not present

## 2022-12-04 DIAGNOSIS — F5105 Insomnia due to other mental disorder: Secondary | ICD-10-CM

## 2022-12-04 DIAGNOSIS — F99 Mental disorder, not otherwise specified: Secondary | ICD-10-CM

## 2022-12-04 DIAGNOSIS — F900 Attention-deficit hyperactivity disorder, predominantly inattentive type: Secondary | ICD-10-CM | POA: Diagnosis not present

## 2022-12-04 DIAGNOSIS — F411 Generalized anxiety disorder: Secondary | ICD-10-CM | POA: Diagnosis not present

## 2022-12-04 MED ORDER — TRAZODONE HCL 150 MG PO TABS: 150.0000 mg | ORAL_TABLET | Freq: Every day | ORAL | 1 refills | Status: DC

## 2022-12-04 MED ORDER — SERTRALINE HCL 100 MG PO TABS
150.0000 mg | ORAL_TABLET | Freq: Every day | ORAL | 1 refills | Status: DC
Start: 2022-12-04 — End: 2023-01-29

## 2022-12-04 MED ORDER — BUPROPION HCL ER (XL) 300 MG PO TB24
300.0000 mg | ORAL_TABLET | Freq: Every day | ORAL | 1 refills | Status: DC
Start: 2022-12-04 — End: 2023-01-29

## 2022-12-04 MED ORDER — HYDROXYZINE HCL 50 MG PO TABS
50.0000 mg | ORAL_TABLET | Freq: Three times a day (TID) | ORAL | 1 refills | Status: DC | PRN
Start: 2022-12-04 — End: 2023-01-29

## 2022-12-04 NOTE — Progress Notes (Signed)
[facility]  Patient ID: Olivia Wu,  MRN: [id number]  PCP: Jarrett Soho, PA-C  Date of Evaluation: 12/04/2022 Time spent:30 minutes  Chief Complaint:  Chief Complaint   Anxiety; Medication Problem; Depression; Medication Refill; Patient Education; Follow-up     HISTORY/CURRENT STATUS: HPI 22 year old female presents to this office for follow up and medication management.  No changes this visit. Says she is doing well mentally but has been dealing with a very severe eczema flair affecting her entire body including full face.  Face is completely covered with flaking skin and healing scabs.  She tries to remain positive and would like to keep her medications the same.  She admits that this is wearing her out mentally and she gets fatigued. 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  She says her anxiety is 5/10 and depression is 3/10 currently. Sleeping 6 hours or less per night due to itching.  She would like to get refills and commits to regular follow ups. Denies mania, no psychosis. No SI/HI.   No prior medication failures.        Individual Medical History/ Review of Systems: Changes? :No   Allergies: Augmentin [amoxicillin-pot clavulanate], Bactrim [sulfamethoxazole-trimethoprim], Cleocin [clindamycin hcl], Codeine, Keflex [cephalexin], and Sulfa antibiotics  Current Medications:  Current Outpatient Medications:    AUVI-Q 0.3 MG/0.3ML SOAJ injection, Use as directed for life-threatening allergic reaction., Disp: 1 each, Rfl: 3   BLISOVI 24 FE 1-20 MG-MCG(24) tablet, , Disp: , Rfl:    buPROPion (WELLBUTRIN XL) 300 MG 24 hr tablet, Take 1 tablet (300 mg total) by mouth daily., Disp: 90 tablet, Rfl: 1   cetirizine (ZYRTEC) 10 MG tablet, Take 10 mg by mouth daily. Take 1 by mouth every night at bedtime., Disp: , Rfl:    eszopiclone (LUNESTA) 2 MG TABS tablet, Take 1 tablet (2 mg total) by mouth at bedtime as needed for sleep. Take immediately  before bedtime, Disp: 30 tablet, Rfl: 1   famotidine (PEPCID) 20 MG tablet, Take 20 mg by mouth 2 (two) times daily., Disp: , Rfl:    hydrOXYzine (ATARAX) 25 MG tablet, Take 1 tablet (25 mg total) by mouth every 6 (six) hours as needed., Disp: 360 tablet, Rfl: 0   hydrOXYzine (ATARAX) 50 MG tablet, Take 1 tablet (50 mg total) by mouth 3 (three) times daily as needed., Disp: 180 tablet, Rfl: 1   mometasone (ELOCON) 0.1 % ointment, Apply sparingly to red, itchy areas once or twice daily., Disp: 90 g, Rfl: 5   sertraline (ZOLOFT) 100 MG tablet, Take 1.5 tablets (150 mg total) by mouth daily., Disp: 135 tablet, Rfl: 1   traZODone (DESYREL) 100 MG tablet, Take 1 tablet (100 mg total) by mouth at bedtime., Disp: 90 tablet, Rfl: 1   traZODone (DESYREL) 150 MG tablet, Take 1 tablet (150 mg total) by mouth at bedtime., Disp: 90 tablet, Rfl: 1   traZODone (DESYREL) 50 MG tablet, Take 1 tablet (50 mg total) by mouth at bedtime., Disp: 30 tablet, Rfl: 3 Medication Side Effects: none  Family Medical/ Social History: Changes? No  MENTAL HEALTH EXAM:  There were no vitals taken for this visit.There is no height or weight on file to calculate BMI.  General Appearance: Casual and Neat  Eye Contact:  Good  Speech:  Clear and Coherent  Volume:  Normal  Mood:  Anxious  Affect:  Appropriate  Thought Process:  Coherent  Orientation:  Full (Time, Place, and Person)  Thought Content: Logical   Suicidal Thoughts:  No  Homicidal Thoughts:  No  Memory:  WNL  Judgement:  Good  Insight:  Good  Psychomotor Activity:  Normal  Concentration:  Concentration: Good  Recall:  Good  Fund of Knowledge: Good  Language: Good  Assets:  Desire for Improvement  ADL's:  Intact  Cognition: WNL  Prognosis:  Good    DIAGNOSES:    ICD-10-CM   1. Generalized anxiety disorder  F41.1 hydrOXYzine (ATARAX) 50 MG tablet    buPROPion (WELLBUTRIN XL) 300 MG 24 hr tablet    sertraline (ZOLOFT) 100 MG tablet    2. Insomnia due  to other mental disorder  F51.05 hydrOXYzine (ATARAX) 50 MG tablet   F99 traZODone (DESYREL) 150 MG tablet    3. Attention deficit hyperactivity disorder (ADHD), predominantly inattentive type  F90.0 buPROPion (WELLBUTRIN XL) 300 MG 24 hr tablet    sertraline (ZOLOFT) 100 MG tablet    4. Recurrent major depressive disorder, in partial remission (HCC)  F33.41 buPROPion (WELLBUTRIN XL) 300 MG 24 hr tablet    sertraline (ZOLOFT) 100 MG tablet      Receiving Psychotherapy: No    RECOMMENDATIONS:   Greater than 50% of  30 min face to face time with patient was spent on counseling and coordination of care. She has significantly improved since last visit. Her depression has subsided since increasing the dose of her zoloft. She is having severe Exzema exacerbation currently and now is on a new medication. She does not want to change any of her current psychiatric medication. Reviewed medication list with her again today.     We agreed to:   Continue Zoloft to 150 mg at bedtime Continue Wellbutrin 300 mg XL in the am Increase Trazodone to 150 mg daily at bedtime Increase Hydroxyzine 50 mg twice daily   Will report any side effects or worsening symptoms To follow up in 8 weeks to reassess. Provided emergency contact information. Reviewed PDMP          Joan Flores, NP

## 2023-01-29 ENCOUNTER — Encounter: Payer: Self-pay | Admitting: Behavioral Health

## 2023-01-29 ENCOUNTER — Ambulatory Visit: Payer: 59 | Admitting: Behavioral Health

## 2023-01-29 DIAGNOSIS — F5105 Insomnia due to other mental disorder: Secondary | ICD-10-CM | POA: Diagnosis not present

## 2023-01-29 DIAGNOSIS — F411 Generalized anxiety disorder: Secondary | ICD-10-CM | POA: Diagnosis not present

## 2023-01-29 DIAGNOSIS — F3341 Major depressive disorder, recurrent, in partial remission: Secondary | ICD-10-CM | POA: Diagnosis not present

## 2023-01-29 DIAGNOSIS — F900 Attention-deficit hyperactivity disorder, predominantly inattentive type: Secondary | ICD-10-CM | POA: Diagnosis not present

## 2023-01-29 DIAGNOSIS — F99 Mental disorder, not otherwise specified: Secondary | ICD-10-CM

## 2023-01-29 MED ORDER — SERTRALINE HCL 100 MG PO TABS: 150.0000 mg | ORAL_TABLET | Freq: Every day | ORAL | 1 refills | Status: DC

## 2023-01-29 MED ORDER — TRAZODONE HCL 150 MG PO TABS: 150.0000 mg | ORAL_TABLET | Freq: Every day | ORAL | 1 refills | Status: DC

## 2023-01-29 MED ORDER — HYDROXYZINE HCL 50 MG PO TABS
50.0000 mg | ORAL_TABLET | Freq: Three times a day (TID) | ORAL | 1 refills | Status: DC | PRN
Start: 2023-01-29 — End: 2023-10-02

## 2023-01-29 MED ORDER — BUPROPION HCL ER (XL) 300 MG PO TB24: 300.0000 mg | ORAL_TABLET | Freq: Every day | ORAL | 1 refills | Status: DC

## 2023-01-29 NOTE — Progress Notes (Signed)
[facility]  Patient ID: Olivia Wu,  MRN: [id number]  PCP: Jarrett Soho, PA-C  Date of Evaluation: 01/29/2023 Time spent:30 minutes  Chief Complaint:  Chief Complaint   Anxiety; Depression; Follow-up; Medication Refill; Patient Education     HISTORY/CURRENT STATUS: HPI 22 year old female presents to this office for follow up and medication management.  Says she is doing well mentally but has been dealing with a very severe eczema flair affecting her entire body including full face.  Face is completely covered with flaking skin and healing scabs. It has improved about 25% since last visit.  She tries to remain positive and would like to keep her medications the same. She is requesting small increase with her hydroxyzine.  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  She says her anxiety is 5/10 and depression is 3/10 currently. Sleeping 6 hours or less per night due to itching.  She would like to get refills and commits to regular follow ups. Denies mania, no psychosis. No SI/HI.   No prior medication failures.      Individual Medical History/ Review of Systems: Changes? :No   Allergies: Augmentin [amoxicillin-pot clavulanate], Bactrim [sulfamethoxazole-trimethoprim], Cleocin [clindamycin hcl], Codeine, Keflex [cephalexin], and Sulfa antibiotics  Current Medications:  Current Outpatient Medications:    AUVI-Q 0.3 MG/0.3ML SOAJ injection, Use as directed for life-threatening allergic reaction., Disp: 1 each, Rfl: 3   BLISOVI 24 FE 1-20 MG-MCG(24) tablet, , Disp: , Rfl:    buPROPion (WELLBUTRIN XL) 300 MG 24 hr tablet, Take 1 tablet (300 mg total) by mouth daily., Disp: 90 tablet, Rfl: 1   cetirizine (ZYRTEC) 10 MG tablet, Take 10 mg by mouth daily. Take 1 by mouth every night at bedtime., Disp: , Rfl:    famotidine (PEPCID) 20 MG tablet, Take 20 mg by mouth 2 (two) times daily., Disp: , Rfl:    hydrOXYzine (ATARAX) 25 MG tablet, Take 1 tablet  (25 mg total) by mouth every 6 (six) hours as needed., Disp: 360 tablet, Rfl: 0   hydrOXYzine (ATARAX) 50 MG tablet, Take 1 tablet (50 mg total) by mouth 3 (three) times daily as needed., Disp: 180 tablet, Rfl: 1   mometasone (ELOCON) 0.1 % ointment, Apply sparingly to red, itchy areas once or twice daily., Disp: 90 g, Rfl: 5   sertraline (ZOLOFT) 100 MG tablet, Take 1.5 tablets (150 mg total) by mouth daily., Disp: 135 tablet, Rfl: 1   traZODone (DESYREL) 100 MG tablet, Take 1 tablet (100 mg total) by mouth at bedtime., Disp: 90 tablet, Rfl: 1   traZODone (DESYREL) 150 MG tablet, Take 1 tablet (150 mg total) by mouth at bedtime., Disp: 90 tablet, Rfl: 1   traZODone (DESYREL) 50 MG tablet, Take 1 tablet (50 mg total) by mouth at bedtime., Disp: 30 tablet, Rfl: 3 Medication Side Effects: none  Family Medical/ Social History: Changes? No  MENTAL HEALTH EXAM:  Weight 168 lb (76.2 kg).Body mass index is 26.31 kg/m.  General Appearance: Casual and Neat  Eye Contact:  Good  Speech:  Clear and Coherent  Volume:  Normal  Mood:  NA  Affect:  Appropriate  Thought Process:  Coherent  Orientation:  Full (Time, Place, and Person)  Thought Content: Logical   Suicidal Thoughts:  No  Homicidal Thoughts:  No  Memory:  WNL  Judgement:  Good  Insight:  Good  Psychomotor Activity:  Normal  Concentration:  Concentration: Good  Recall:  Good  Fund of Knowledge:  Good  Language: Good  Assets:  Desire for Improvement  ADL's:  Intact  Cognition: WNL  Prognosis:  Good    DIAGNOSES:    ICD-10-CM   1. Generalized anxiety disorder  F41.1 hydrOXYzine (ATARAX) 50 MG tablet    sertraline (ZOLOFT) 100 MG tablet    buPROPion (WELLBUTRIN XL) 300 MG 24 hr tablet    2. Insomnia due to other mental disorder  F51.05 traZODone (DESYREL) 150 MG tablet   F99 hydrOXYzine (ATARAX) 50 MG tablet    3. Attention deficit hyperactivity disorder (ADHD), predominantly inattentive type  F90.0 sertraline (ZOLOFT) 100 MG  tablet    buPROPion (WELLBUTRIN XL) 300 MG 24 hr tablet    4. Recurrent major depressive disorder, in partial remission (HCC)  F33.41 sertraline (ZOLOFT) 100 MG tablet    buPROPion (WELLBUTRIN XL) 300 MG 24 hr tablet      Receiving Psychotherapy: No    RECOMMENDATIONS:   Greater than 50% of  30 min face to face time with patient was spent on counseling and coordination of care. She has significantly improved since last visit. Her depression has subsided since increasing the dose of her zoloft. She is still  having severe Exzema exacerbation but it is now in healing phase. This episode in now in 8th month. She does not want to change any of her current psychiatric medication. Reviewed medication list with her again today.     We agreed to:   Continue Zoloft to 150 mg at bedtime Continue Wellbutrin 300 mg XL in the am Increase Trazodone to 150 mg daily at bedtime Increase Hydroxyzine 50 mg twice daily   Will report any side effects or worsening symptoms To follow up in 3 months to reassess. Provided emergency contact information. Reviewed PDMP      Joan Flores, NP

## 2023-01-29 NOTE — Progress Notes (Deleted)
[facility]  Patient ID: JACKEY FORNSHELL,  MRN: [id number]  PCP: Jarrett Soho, PA-C  Date of Evaluation: 01/29/2023 Time spent:30 minutes  Chief Complaint:  Chief Complaint   Anxiety; Depression; Follow-up; Medication Refill; Patient Education     HISTORY/CURRENT STATUS: HPI  Individual Medical History/ Review of Systems: Changes? :No   Allergies: Augmentin [amoxicillin-pot clavulanate], Bactrim [sulfamethoxazole-trimethoprim], Cleocin [clindamycin hcl], Codeine, Keflex [cephalexin], and Sulfa antibiotics  Current Medications:  Current Outpatient Medications:    AUVI-Q 0.3 MG/0.3ML SOAJ injection, Use as directed for life-threatening allergic reaction., Disp: 1 each, Rfl: 3   BLISOVI 24 FE 1-20 MG-MCG(24) tablet, , Disp: , Rfl:    buPROPion (WELLBUTRIN XL) 300 MG 24 hr tablet, Take 1 tablet (300 mg total) by mouth daily., Disp: 90 tablet, Rfl: 1   cetirizine (ZYRTEC) 10 MG tablet, Take 10 mg by mouth daily. Take 1 by mouth every night at bedtime., Disp: , Rfl:    famotidine (PEPCID) 20 MG tablet, Take 20 mg by mouth 2 (two) times daily., Disp: , Rfl:    hydrOXYzine (ATARAX) 25 MG tablet, Take 1 tablet (25 mg total) by mouth every 6 (six) hours as needed., Disp: 360 tablet, Rfl: 0   hydrOXYzine (ATARAX) 50 MG tablet, Take 1 tablet (50 mg total) by mouth 3 (three) times daily as needed., Disp: 180 tablet, Rfl: 1   mometasone (ELOCON) 0.1 % ointment, Apply sparingly to red, itchy areas once or twice daily., Disp: 90 g, Rfl: 5   sertraline (ZOLOFT) 100 MG tablet, Take 1.5 tablets (150 mg total) by mouth daily., Disp: 135 tablet, Rfl: 1   traZODone (DESYREL) 100 MG tablet, Take 1 tablet (100 mg total) by mouth at bedtime., Disp: 90 tablet, Rfl: 1   traZODone (DESYREL) 150 MG tablet, Take 1 tablet (150 mg total) by mouth at bedtime., Disp: 90 tablet, Rfl: 1   traZODone (DESYREL) 50 MG tablet, Take 1 tablet (50 mg total) by mouth at bedtime., Disp: 30 tablet, Rfl:  3 Medication Side Effects: none  Family Medical/ Social History: Changes? No  MENTAL HEALTH EXAM:  Weight 168 lb (76.2 kg).Body mass index is 26.31 kg/m.  General Appearance: Casual, Neat, and Well Groomed  Eye Contact:  Good  Speech:  Clear and Coherent  Volume:  Normal  Mood:  Anxious, Depressed, and Dysphoric  Affect:  Appropriate  Thought Process:  Coherent  Orientation:  Full (Time, Place, and Person)  Thought Content: Logical   Suicidal Thoughts:  No  Homicidal Thoughts:  No  Memory:  WNL  Judgement:  Good  Insight:  Good  Psychomotor Activity:  Normal  Concentration:  Concentration: Good  Recall:  Good  Fund of Knowledge: Good  Language: Good  Assets:  Desire for Improvement  ADL's:  Intact  Cognition: WNL  Prognosis:  Good    DIAGNOSES:    ICD-10-CM   1. Generalized anxiety disorder  F41.1 hydrOXYzine (ATARAX) 50 MG tablet    sertraline (ZOLOFT) 100 MG tablet    buPROPion (WELLBUTRIN XL) 300 MG 24 hr tablet    2. Insomnia due to other mental disorder  F51.05 traZODone (DESYREL) 150 MG tablet   F99 hydrOXYzine (ATARAX) 50 MG tablet    3. Attention deficit hyperactivity disorder (ADHD), predominantly inattentive type  F90.0 sertraline (ZOLOFT) 100 MG tablet    buPROPion (WELLBUTRIN XL) 300 MG 24 hr tablet    4. Recurrent major depressive disorder, in partial remission (HCC)  F33.41 sertraline (ZOLOFT) 100 MG tablet  buPROPion (WELLBUTRIN XL) 300 MG 24 hr tablet      Receiving Psychotherapy: No    RECOMMENDATIONS: ***   Joan Flores, NP

## 2023-04-11 ENCOUNTER — Other Ambulatory Visit: Payer: Self-pay | Admitting: Family Medicine

## 2023-04-11 DIAGNOSIS — R599 Enlarged lymph nodes, unspecified: Secondary | ICD-10-CM

## 2023-04-30 ENCOUNTER — Ambulatory Visit: Payer: 59 | Admitting: Behavioral Health

## 2023-04-30 ENCOUNTER — Encounter: Payer: Self-pay | Admitting: Behavioral Health

## 2023-04-30 DIAGNOSIS — F900 Attention-deficit hyperactivity disorder, predominantly inattentive type: Secondary | ICD-10-CM

## 2023-04-30 DIAGNOSIS — F99 Mental disorder, not otherwise specified: Secondary | ICD-10-CM

## 2023-04-30 DIAGNOSIS — F3341 Major depressive disorder, recurrent, in partial remission: Secondary | ICD-10-CM

## 2023-04-30 DIAGNOSIS — F5105 Insomnia due to other mental disorder: Secondary | ICD-10-CM

## 2023-04-30 DIAGNOSIS — F411 Generalized anxiety disorder: Secondary | ICD-10-CM

## 2023-04-30 NOTE — Progress Notes (Signed)
[facility]  Patient ID: Olivia Wu,  MRN: [id number]  PCP: Jarrett Soho, PA-C  Date of Evaluation: 04/30/2023 Time spent:30 minutes  Chief Complaint:  Chief Complaint   Anxiety; Depression; Follow-up; Medication Refill; Patient Education     HISTORY/CURRENT STATUS: HPI  22 year old female presents to this office for follow up and medication management.  Says she is doing well mentally but has been dealing with a very severe eczema flair affecting her entire body including full face.  Finally some progress and in last stages of healing.  It has improved about 75% since last visit.  She tries to remain positive and would like to keep her medications the same. She is requesting small increase with her hydroxyzine.  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  She says her anxiety is 3/10 and depression is 3/10 currently. Sleeping 6 hours or less per night due to itching.  She would like to get refills and commits to regular follow ups. Denies mania, no psychosis. No SI/HI.   No prior medication failures.     Individual Medical History/ Review of Systems: Changes? :No   Allergies: Augmentin [amoxicillin-pot clavulanate], Bactrim [sulfamethoxazole-trimethoprim], Cleocin [clindamycin hcl], Codeine, Keflex [cephalexin], and Sulfa antibiotics  Current Medications:  Current Outpatient Medications:    AUVI-Q 0.3 MG/0.3ML SOAJ injection, Use as directed for life-threatening allergic reaction., Disp: 1 each, Rfl: 3   buPROPion (WELLBUTRIN XL) 300 MG 24 hr tablet, Take 1 tablet (300 mg total) by mouth daily., Disp: 90 tablet, Rfl: 1   cetirizine (ZYRTEC) 10 MG tablet, Take 10 mg by mouth daily. Take 1 by mouth every night at bedtime., Disp: , Rfl:    famotidine (PEPCID) 20 MG tablet, Take 20 mg by mouth 2 (two) times daily., Disp: , Rfl:    hydrOXYzine (ATARAX) 25 MG tablet, Take 1 tablet (25 mg total) by mouth every 6 (six) hours as needed., Disp: 360  tablet, Rfl: 0   sertraline (ZOLOFT) 100 MG tablet, Take 1.5 tablets (150 mg total) by mouth daily., Disp: 135 tablet, Rfl: 1   traZODone (DESYREL) 150 MG tablet, Take 1 tablet (150 mg total) by mouth at bedtime., Disp: 90 tablet, Rfl: 1   BLISOVI 24 FE 1-20 MG-MCG(24) tablet, , Disp: , Rfl:    hydrOXYzine (ATARAX) 50 MG tablet, Take 1 tablet (50 mg total) by mouth 3 (three) times daily as needed. (Patient not taking: Reported on 04/30/2023), Disp: 180 tablet, Rfl: 1   mometasone (ELOCON) 0.1 % ointment, Apply sparingly to red, itchy areas once or twice daily. (Patient not taking: Reported on 04/30/2023), Disp: 90 g, Rfl: 5   traZODone (DESYREL) 100 MG tablet, Take 1 tablet (100 mg total) by mouth at bedtime. (Patient not taking: Reported on 04/30/2023), Disp: 90 tablet, Rfl: 1   traZODone (DESYREL) 50 MG tablet, Take 1 tablet (50 mg total) by mouth at bedtime. (Patient not taking: Reported on 04/30/2023), Disp: 30 tablet, Rfl: 3 Medication Side Effects: none  Family Medical/ Social History: Changes? No  MENTAL HEALTH EXAM:  There were no vitals taken for this visit.There is no height or weight on file to calculate BMI.  General Appearance: Casual, Neat, and Well Groomed  Eye Contact:  Good  Speech:  Clear and Coherent  Volume:  Normal  Mood:  NA  Affect:  Appropriate  Thought Process:  Coherent  Orientation:  Full (Time, Place, and Person)  Thought Content: Logical   Suicidal Thoughts:  No  Homicidal Thoughts:  No  Memory:  WNL  Judgement:  Good  Insight:  Good  Psychomotor Activity:  Normal  Concentration:  Concentration: Good  Recall:  Good  Fund of Knowledge: Good  Language: Good  Assets:  Desire for Improvement  ADL's:  Intact  Cognition: WNL  Prognosis:  Good    DIAGNOSES: No diagnosis found.  Receiving Psychotherapy: No    RECOMMENDATIONS:   Greater than 50% of  30 min face to face time with patient was spent on counseling and coordination of care. She has  significantly improved since last visit. Her depression has subsided since increasing the dose of her zoloft. She is still  having severe Exzema exacerbation but it is now in healing phase. This episode in now in 11 month. She does not want to change any of her current psychiatric medication. Reviewed medication list with her again today.     We agreed to:   Continue Zoloft to 150 mg at bedtime Continue Wellbutrin 300 mg XL in the am Increase Trazodone to 150 mg daily at bedtime Increase Hydroxyzine 50 mg twice daily   Will report any side effects or worsening symptoms To follow up in 3 months to reassess. Provided emergency contact information. Reviewed PDMP      Joan Flores, NP

## 2023-05-04 ENCOUNTER — Ambulatory Visit
Admission: RE | Admit: 2023-05-04 | Discharge: 2023-05-04 | Disposition: A | Discharge status: Home / Self Care | Payer: 59 | Source: Ambulatory Visit | Attending: Family Medicine | Admitting: Family Medicine

## 2023-05-04 DIAGNOSIS — R599 Enlarged lymph nodes, unspecified: Secondary | ICD-10-CM

## 2023-05-15 ENCOUNTER — Other Ambulatory Visit: Payer: Self-pay | Admitting: Family Medicine

## 2023-05-15 DIAGNOSIS — R590 Localized enlarged lymph nodes: Secondary | ICD-10-CM

## 2023-05-29 ENCOUNTER — Inpatient Hospital Stay: Admission: RE | Admit: 2023-05-29 | Payer: 59 | Source: Ambulatory Visit

## 2023-05-29 ENCOUNTER — Ambulatory Visit
Admission: RE | Admit: 2023-05-29 | Discharge: 2023-05-29 | Disposition: A | Discharge status: Home / Self Care | Payer: 59 | Source: Ambulatory Visit | Attending: Family Medicine | Admitting: Family Medicine

## 2023-05-29 DIAGNOSIS — R590 Localized enlarged lymph nodes: Secondary | ICD-10-CM

## 2023-06-12 ENCOUNTER — Other Ambulatory Visit (HOSPITAL_COMMUNITY): Payer: Self-pay | Admitting: Family Medicine

## 2023-06-12 DIAGNOSIS — R591 Generalized enlarged lymph nodes: Secondary | ICD-10-CM

## 2023-06-13 NOTE — Progress Notes (Signed)
Olivia Overlie, MD  Olivia Wu PROCEDURE / BIOPSY REVIEW Date: 06/13/23  Requested Biopsy site: Neck lymph node Reason for request: Needs diagnosis Imaging review: Best seen on CT neck  Decision: Approved Imaging modality to perform: Ultrasound Schedule with: Patient preference (Local vs Mod Sed) Schedule for: Any VIR  Additional comments: Evaluate neck with [place]. Prominent submandibular / sublingual and left posterior neck node.  Please contact me with questions, concerns, or if issue pertaining to this request arise.  Arn Medal, MD [facility]       Previous Messages    ----- Message ----- From: Olivia Wu Sent: 06/12/2023   2:28 PM EST To: Olivia Wu; Ir Procedure Requests Subject: US BIOPSY ( soft tissue)                      Procedure: US BIOPSY (soft tissue)  Reason : biopsy of L neck lymphnode seen on CT scan - r/o inflammatory vs. malignancy Dx: Lymphadenopathy [R59.1 (ICD-10-CM)]  History: CT soft tissue neck w/o , US soft tissue head&neck  Provider : Jarrett Soho, PA-C  Provider contact :  [phone]

## 2023-07-06 ENCOUNTER — Other Ambulatory Visit: Payer: Self-pay | Admitting: Radiology

## 2023-07-09 ENCOUNTER — Ambulatory Visit (HOSPITAL_COMMUNITY)
Admission: RE | Admit: 2023-07-09 | Discharge: 2023-07-09 | Disposition: A | Discharge status: Home / Self Care | Payer: 59 | Source: Ambulatory Visit | Attending: Family Medicine | Admitting: Family Medicine

## 2023-07-09 DIAGNOSIS — R591 Generalized enlarged lymph nodes: Secondary | ICD-10-CM

## 2023-07-09 DIAGNOSIS — R590 Localized enlarged lymph nodes: Secondary | ICD-10-CM | POA: Diagnosis present

## 2023-07-09 MED ORDER — LIDOCAINE HCL 1 % IJ SOLN
INTRAMUSCULAR | Status: AC
  Filled 2023-07-09: qty 20

## 2023-07-09 NOTE — Procedures (Signed)
 Interventional Radiology Procedure Note  Procedure: [place] CORE BX LEFT SUBMENTAL ADENOPATHY    Complications: None  Estimated Blood Loss:  MIN  Findings: 18 G CORES IN SALINE    Sharen Counter, MD

## 2023-07-11 LAB — SURGICAL PATHOLOGY

## 2023-07-13 ENCOUNTER — Telehealth: Payer: Self-pay | Admitting: Behavioral Health

## 2023-07-13 DIAGNOSIS — F411 Generalized anxiety disorder: Secondary | ICD-10-CM

## 2023-07-13 DIAGNOSIS — F5105 Insomnia due to other mental disorder: Secondary | ICD-10-CM

## 2023-07-13 NOTE — Telephone Encounter (Signed)
Pt called and said that she needs a refill on her trazodone 100 mg. Pharmacy is [facility]. Next appt is in february

## 2023-07-13 NOTE — Telephone Encounter (Signed)
Last note said 150 mg, but patient has been on multiple doses. Called and LVM per DPR requesting confirmation of dose.

## 2023-07-16 MED ORDER — TRAZODONE HCL 100 MG PO TABS: 100.0000 mg | ORAL_TABLET | Freq: Every day | ORAL | 1 refills | Status: DC

## 2023-07-16 NOTE — Telephone Encounter (Signed)
Sent trazodone

## 2023-07-31 ENCOUNTER — Ambulatory Visit: Payer: 59 | Admitting: Behavioral Health

## 2023-07-31 ENCOUNTER — Encounter: Payer: Self-pay | Admitting: Behavioral Health

## 2023-07-31 DIAGNOSIS — F3341 Major depressive disorder, recurrent, in partial remission: Secondary | ICD-10-CM

## 2023-07-31 DIAGNOSIS — F411 Generalized anxiety disorder: Secondary | ICD-10-CM

## 2023-07-31 DIAGNOSIS — F99 Mental disorder, not otherwise specified: Secondary | ICD-10-CM

## 2023-07-31 DIAGNOSIS — F900 Attention-deficit hyperactivity disorder, predominantly inattentive type: Secondary | ICD-10-CM | POA: Diagnosis not present

## 2023-07-31 DIAGNOSIS — F5105 Insomnia due to other mental disorder: Secondary | ICD-10-CM | POA: Diagnosis not present

## 2023-07-31 NOTE — Progress Notes (Signed)
 [facility]  Patient ID: Olivia Wu,  MRN: [id number]  PCP: Katina Pfeiffer, PA-C  Date of Evaluation: 07/31/2023 Time spent:30 minutes  Chief Complaint:   HISTORY/CURRENT STATUS: HPI 23  year old female presents to this office for follow up and medication management.  Says she is doing well mentally but has been dealing with a very severe eczema flair affecting her entire body. She was improving late last year before another flair. She tries to remain positive and would like to keep her medications the same.  She will be graduating from college in May but she has not been able to work due to her severe condition.  She says her anxiety is 3/10 and depression is 3/10 currently. Sleeping 6 hours or less per night due to itching.  She would like to get refills and commits to regular follow ups. Denies mania, no psychosis. No SI/HI.   No prior medication failures.  Individual Medical History/ Review of Systems: Changes? :No   Allergies: Augmentin [amoxicillin-pot clavulanate], Bactrim [sulfamethoxazole-trimethoprim], Cleocin [clindamycin hcl], Codeine, Keflex [cephalexin], and Sulfa antibiotics  Current Medications:  Current Outpatient Medications:    AUVI-Q  0.3 MG/0.3ML SOAJ injection, Use as directed for life-threatening allergic reaction., Disp: 1 each, Rfl: 3   BLISOVI 24 FE 1-20 MG-MCG(24) tablet, , Disp: , Rfl:    buPROPion  (WELLBUTRIN  XL) 300 MG 24 hr tablet, Take 1 tablet (300 mg total) by mouth daily., Disp: 90 tablet, Rfl: 1   cetirizine (ZYRTEC) 10 MG tablet, Take 10 mg by mouth daily. Take 1 by mouth every night at bedtime., Disp: , Rfl:    famotidine (PEPCID) 20 MG tablet, Take 20 mg by mouth 2 (two) times daily., Disp: , Rfl:    hydrOXYzine  (ATARAX ) 25 MG tablet, Take 1 tablet (25 mg total) by mouth every 6 (six) hours as needed., Disp: 360 tablet, Rfl: 0   hydrOXYzine  (ATARAX ) 50 MG tablet, Take 1 tablet (50 mg total) by mouth 3 (three) times daily as needed.  (Patient not taking: Reported on 04/30/2023), Disp: 180 tablet, Rfl: 1   mometasone  (ELOCON ) 0.1 % ointment, Apply sparingly to red, itchy areas once or twice daily. (Patient not taking: Reported on 04/30/2023), Disp: 90 g, Rfl: 5   sertraline  (ZOLOFT ) 100 MG tablet, Take 1.5 tablets (150 mg total) by mouth daily., Disp: 135 tablet, Rfl: 1   traZODone  (DESYREL ) 100 MG tablet, Take 1 tablet (100 mg total) by mouth at bedtime., Disp: 90 tablet, Rfl: 1   traZODone  (DESYREL ) 150 MG tablet, Take 1 tablet (150 mg total) by mouth at bedtime., Disp: 90 tablet, Rfl: 1   traZODone  (DESYREL ) 50 MG tablet, Take 1 tablet (50 mg total) by mouth at bedtime. (Patient not taking: Reported on 04/30/2023), Disp: 30 tablet, Rfl: 3 Medication Side Effects: none  Family Medical/ Social History: Changes? No  MENTAL HEALTH EXAM:  There were no vitals taken for this visit.There is no height or weight on file to calculate BMI.  General Appearance: Casual and Neat  Eye Contact:  Good  Speech:  Clear and Coherent  Volume:  Normal  Mood:  NA  Affect:  Appropriate  Thought Process:  Coherent  Orientation:  Full (Time, Place, and Person)  Thought Content: Logical   Suicidal Thoughts:  No  Homicidal Thoughts:  No  Memory:  WNL  Judgement:  Good  Insight:  Good  Psychomotor Activity:  Normal  Concentration:  Concentration: Good  Recall:  Good  Fund of Knowledge: Good  Language:  Good  Assets:  Desire for Improvement  ADL's:  Intact  Cognition: WNL  Prognosis:  Good    DIAGNOSES: No diagnosis found.  Receiving Psychotherapy: No    RECOMMENDATIONS:   Greater than 50% of  30 min face to face time with patient was spent on counseling and coordination of care. We discussed her good stability with anxiety and depression. She does a good job of practicing healthy coping mechanisms and keeping a great attitude considering her struggle with debilitating eczema.  This episode in now over one year. She does not want to  change any of her current psychiatric medication. Reviewed medication list with her again today.     We agreed to:   Continue Zoloft  to 150 mg at bedtime Continue Wellbutrin  300 mg XL in the am Increase Trazodone  to 150 mg daily at bedtime Increase Hydroxyzine  50 mg twice daily   Will report any side effects or worsening symptoms To follow up in 3 months to reassess. Provided emergency contact information. Reviewed PDMP  Redell DELENA Pizza, NP

## 2023-09-05 ENCOUNTER — Other Ambulatory Visit: Payer: Self-pay

## 2023-09-05 DIAGNOSIS — F411 Generalized anxiety disorder: Secondary | ICD-10-CM

## 2023-09-05 DIAGNOSIS — F3341 Major depressive disorder, recurrent, in partial remission: Secondary | ICD-10-CM

## 2023-09-05 DIAGNOSIS — F900 Attention-deficit hyperactivity disorder, predominantly inattentive type: Secondary | ICD-10-CM

## 2023-09-05 MED ORDER — SERTRALINE HCL 100 MG PO TABS: 150.0000 mg | ORAL_TABLET | Freq: Every day | ORAL | 1 refills | Status: DC

## 2023-10-02 ENCOUNTER — Telehealth: Payer: Self-pay | Admitting: Behavioral Health

## 2023-10-02 DIAGNOSIS — F5105 Insomnia due to other mental disorder: Secondary | ICD-10-CM

## 2023-10-02 DIAGNOSIS — F411 Generalized anxiety disorder: Secondary | ICD-10-CM

## 2023-10-02 MED ORDER — HYDROXYZINE HCL 50 MG PO TABS: 50.0000 mg | ORAL_TABLET | Freq: Two times a day (BID) | ORAL | 0 refills | Status: DC | PRN

## 2023-10-02 NOTE — Telephone Encounter (Signed)
 Rx for hydroxyzine sent to [facility]

## 2023-10-02 NOTE — Telephone Encounter (Signed)
 Pt lvm that she needs a new script of hydroxyzine 50 mg. She takes one in the am and one in the pm. Please send to [facility]

## 2023-10-29 ENCOUNTER — Encounter: Payer: Self-pay | Admitting: Behavioral Health

## 2023-10-29 ENCOUNTER — Ambulatory Visit: Payer: 59 | Admitting: Behavioral Health

## 2023-10-29 VITALS — BP 119/82 | HR 94 | Wt 142.0 lb

## 2023-10-29 DIAGNOSIS — F411 Generalized anxiety disorder: Secondary | ICD-10-CM | POA: Diagnosis not present

## 2023-10-29 DIAGNOSIS — F3341 Major depressive disorder, recurrent, in partial remission: Secondary | ICD-10-CM | POA: Diagnosis not present

## 2023-10-29 DIAGNOSIS — F5105 Insomnia due to other mental disorder: Secondary | ICD-10-CM | POA: Diagnosis not present

## 2023-10-29 DIAGNOSIS — F900 Attention-deficit hyperactivity disorder, predominantly inattentive type: Secondary | ICD-10-CM | POA: Diagnosis not present

## 2023-10-29 DIAGNOSIS — F99 Mental disorder, not otherwise specified: Secondary | ICD-10-CM

## 2023-10-29 MED ORDER — SERTRALINE HCL 100 MG PO TABS: 150.0000 mg | ORAL_TABLET | Freq: Every day | ORAL | 1 refills | Status: DC

## 2023-10-29 MED ORDER — BUPROPION HCL ER (XL) 300 MG PO TB24: 300.0000 mg | ORAL_TABLET | Freq: Every day | ORAL | 1 refills | Status: DC

## 2023-10-29 MED ORDER — TRAZODONE HCL 100 MG PO TABS: 100.0000 mg | ORAL_TABLET | Freq: Every day | ORAL | 1 refills | Status: DC

## 2023-10-29 MED ORDER — HYDROXYZINE HCL 50 MG PO TABS: 50.0000 mg | ORAL_TABLET | Freq: Two times a day (BID) | ORAL | 1 refills | Status: DC | PRN

## 2023-10-29 NOTE — Progress Notes (Signed)
 [facility]  Patient ID: Olivia Wu,  MRN: [id number]  PCP: Darnelle Elders, PA-C  Date of Evaluation: 10/29/2023 Time spent:30 minutes  Chief Complaint:  Chief Complaint   Depression; Anxiety; Follow-up; Medication Refill; Patient Education     HISTORY/CURRENT STATUS: HPI  23  year old female presents to this office for follow up and medication management.  Says she is doing well mentally but has been dealing with a very severe eczema flair affecting her entire body. She is looking much better with disease slowing. Her skin is looking much better.  She is graduating from college next week. Exciting and skin of face has significantly cleared just in time for graduation picks.  She says her anxiety is 3/10 and depression is 3/10 currently. Sleeping 7 hours or less per night due to itching.  She would like to get refills and commits to regular follow ups. Denies mania, no psychosis. No SI/HI.  Individual Medical History/ Review of Systems: Changes? :No   Allergies: Augmentin [amoxicillin-pot clavulanate], Bactrim [sulfamethoxazole-trimethoprim], Cleocin [clindamycin hcl], Codeine, Keflex [cephalexin], and Sulfa antibiotics  Current Medications:  Current Outpatient Medications:    traZODone  (DESYREL ) 100 MG tablet, Take 1 tablet (100 mg total) by mouth at bedtime., Disp: 90 tablet, Rfl: 1   AUVI-Q  0.3 MG/0.3ML SOAJ injection, Use as directed for life-threatening allergic reaction., Disp: 1 each, Rfl: 3   BLISOVI 24 FE 1-20 MG-MCG(24) tablet, , Disp: , Rfl:    buPROPion  (WELLBUTRIN  XL) 300 MG 24 hr tablet, Take 1 tablet (300 mg total) by mouth daily., Disp: 90 tablet, Rfl: 1   cetirizine (ZYRTEC) 10 MG tablet, Take 10 mg by mouth daily. Take 1 by mouth every night at bedtime., Disp: , Rfl:    famotidine (PEPCID) 20 MG tablet, Take 20 mg by mouth 2 (two) times daily., Disp: , Rfl:    hydrOXYzine  (ATARAX ) 50 MG tablet, Take 1 tablet (50 mg total) by mouth 2 (two) times  daily as needed., Disp: 180 tablet, Rfl: 1   mometasone  (ELOCON ) 0.1 % ointment, Apply sparingly to red, itchy areas once or twice daily. (Patient not taking: Reported on 04/30/2023), Disp: 90 g, Rfl: 5   sertraline  (ZOLOFT ) 100 MG tablet, Take 1.5 tablets (150 mg total) by mouth daily., Disp: 135 tablet, Rfl: 1   traZODone  (DESYREL ) 100 MG tablet, Take 1 tablet (100 mg total) by mouth at bedtime., Disp: 90 tablet, Rfl: 1   traZODone  (DESYREL ) 50 MG tablet, Take 1 tablet (50 mg total) by mouth at bedtime. (Patient not taking: Reported on 04/30/2023), Disp: 30 tablet, Rfl: 3 Medication Side Effects: none  Family Medical/ Social History: Changes? No  MENTAL HEALTH EXAM:  Blood pressure 119/82, pulse 94, weight 142 lb (64.4 kg).Body mass index is 22.24 kg/m.  General Appearance: Casual, Neat, and Well Groomed  Eye Contact:  Good  Speech:  Clear and Coherent  Volume:  Normal  Mood:  Anxious  Affect:  Anxious  Thought Process:  Coherent  Orientation:  Full (Time, Place, and Person)  Thought Content: Logical   Suicidal Thoughts:  No  Homicidal Thoughts:  No  Memory:  WNL  Judgement:  Good  Insight:  Good  Psychomotor Activity:  Normal  Concentration:  Concentration: Good  Recall:  Good  Fund of Knowledge: Good  Language: Good  Assets:  Desire for Improvement  ADL's:  Intact  Cognition: WNL  Prognosis:  Good    DIAGNOSES:    ICD-10-CM   1. Insomnia due to other  mental disorder  F51.05 hydrOXYzine  (ATARAX ) 50 MG tablet   F99     2. Generalized anxiety disorder  F41.1 hydrOXYzine  (ATARAX ) 50 MG tablet    buPROPion  (WELLBUTRIN  XL) 300 MG 24 hr tablet    sertraline  (ZOLOFT ) 100 MG tablet    3. Attention deficit hyperactivity disorder (ADHD), predominantly inattentive type  F90.0 buPROPion  (WELLBUTRIN  XL) 300 MG 24 hr tablet    sertraline  (ZOLOFT ) 100 MG tablet    4. Recurrent major depressive disorder, in partial remission (HCC)  F33.41 buPROPion  (WELLBUTRIN  XL) 300 MG 24 hr  tablet    sertraline  (ZOLOFT ) 100 MG tablet      Receiving Psychotherapy: No    RECOMMENDATIONS:   Greater than 50% of  30 min face to face time with patient was spent on counseling and coordination of care. We discussed her good stability with anxiety and depression. She does a good job of practicing healthy coping mechanisms . She scratches her lower arms and hands a lot during interview.  She wears protective sleeves to prevent more scratching and damage to skin.   She does not want to change any of her current psychiatric medication. Reviewed medication list with her again today.     We agreed to:   Continue Zoloft  to 150 mg at bedtime Continue Wellbutrin  300 mg XL in the am Continue Trazodone  to 150 mg daily at bedtime Increase Hydroxyzine  50 mg twice daily   Will report any side effects or worsening symptoms To follow up in 3 months to reassess. Provided emergency contact information. Reviewed PDMP   Lincoln Renshaw, NP

## 2024-01-29 ENCOUNTER — Ambulatory Visit: Admitting: Behavioral Health

## 2024-01-29 ENCOUNTER — Encounter: Payer: Self-pay | Admitting: Behavioral Health

## 2024-01-29 DIAGNOSIS — F3341 Major depressive disorder, recurrent, in partial remission: Secondary | ICD-10-CM | POA: Diagnosis not present

## 2024-01-29 DIAGNOSIS — F411 Generalized anxiety disorder: Secondary | ICD-10-CM

## 2024-01-29 DIAGNOSIS — F5105 Insomnia due to other mental disorder: Secondary | ICD-10-CM | POA: Diagnosis not present

## 2024-01-29 DIAGNOSIS — F900 Attention-deficit hyperactivity disorder, predominantly inattentive type: Secondary | ICD-10-CM | POA: Diagnosis not present

## 2024-01-29 DIAGNOSIS — F99 Mental disorder, not otherwise specified: Secondary | ICD-10-CM

## 2024-01-29 MED ORDER — HYDROXYZINE HCL 50 MG PO TABS: 50.0000 mg | ORAL_TABLET | Freq: Two times a day (BID) | ORAL | 1 refills | Status: DC | PRN

## 2024-01-29 MED ORDER — BUPROPION HCL ER (XL) 300 MG PO TB24: 300.0000 mg | ORAL_TABLET | Freq: Every day | ORAL | 1 refills | Status: DC

## 2024-01-29 MED ORDER — SERTRALINE HCL 100 MG PO TABS: 150.0000 mg | ORAL_TABLET | Freq: Every day | ORAL | 1 refills | Status: DC

## 2024-01-29 MED ORDER — TRAZODONE HCL 100 MG PO TABS: 100.0000 mg | ORAL_TABLET | Freq: Every day | ORAL | 1 refills | Status: DC

## 2024-01-29 NOTE — Progress Notes (Signed)
 [facility]  Patient ID: Olivia Wu,  MRN: [id number]  PCP: Katina Pfeiffer, PA-C  Date of Evaluation: 01/29/2024 Time spent:30 minutes  Chief Complaint:  Chief Complaint   Anxiety; Depression; Follow-up; Patient Education; Medication Refill; Stress     HISTORY/CURRENT STATUS: HPI 23  year old female presents to this office for follow up and medication management. No changes this visit.  Discouraged recently, continues to deal with a very severe eczema flair affecting her entire body. She is looking much better with disease slowing. Her skin is looking much better.  She is graduating from college next week. Exciting and skin of face has significantly cleared just in time for graduation picks.  She says her anxiety is 5/10 and depression is 4/10 currently. Sleeping 7 hours or less per night due to itching.  She would like to get refills and commits to regular follow ups. Denies mania, no psychosis. No SI/HI.  Individual Medical History/ Review of Systems: Changes? :No   Allergies: Augmentin [amoxicillin-pot clavulanate], Bactrim [sulfamethoxazole-trimethoprim], Cleocin [clindamycin hcl], Codeine, Keflex [cephalexin], and Sulfa antibiotics  Current Medications:  Current Outpatient Medications:    AUVI-Q  0.3 MG/0.3ML SOAJ injection, Use as directed for life-threatening allergic reaction., Disp: 1 each, Rfl: 3   BLISOVI 24 FE 1-20 MG-MCG(24) tablet, , Disp: , Rfl:    buPROPion  (WELLBUTRIN  XL) 300 MG 24 hr tablet, Take 1 tablet (300 mg total) by mouth daily., Disp: 90 tablet, Rfl: 1   cetirizine (ZYRTEC) 10 MG tablet, Take 10 mg by mouth daily. Take 1 by mouth every night at bedtime., Disp: , Rfl:    famotidine (PEPCID) 20 MG tablet, Take 20 mg by mouth 2 (two) times daily., Disp: , Rfl:    hydrOXYzine  (ATARAX ) 50 MG tablet, Take 1 tablet (50 mg total) by mouth 2 (two) times daily as needed., Disp: 180 tablet, Rfl: 1   mometasone  (ELOCON ) 0.1 % ointment, Apply sparingly to  red, itchy areas once or twice daily. (Patient not taking: Reported on 04/30/2023), Disp: 90 g, Rfl: 5   sertraline  (ZOLOFT ) 100 MG tablet, Take 1.5 tablets (150 mg total) by mouth daily., Disp: 135 tablet, Rfl: 1   traZODone  (DESYREL ) 100 MG tablet, Take 1 tablet (100 mg total) by mouth at bedtime., Disp: 90 tablet, Rfl: 1   traZODone  (DESYREL ) 100 MG tablet, Take 1 tablet (100 mg total) by mouth at bedtime., Disp: 90 tablet, Rfl: 1   traZODone  (DESYREL ) 50 MG tablet, Take 1 tablet (50 mg total) by mouth at bedtime. (Patient not taking: Reported on 04/30/2023), Disp: 30 tablet, Rfl: 3 Medication Side Effects: none  Family Medical/ Social History: Changes? No  MENTAL HEALTH EXAM:  There were no vitals taken for this visit.There is no height or weight on file to calculate BMI.  General Appearance: Casual  Eye Contact:  Fair  Speech:  Clear and Coherent  Volume:  Normal  Mood:  Anxious, Depressed, and Dysphoric  Affect:  Appropriate, Congruent, Depressed, and Anxious  Thought Process:  Coherent  Orientation:  Full (Time, Place, and Person)  Thought Content: Logical   Suicidal Thoughts:  No  Homicidal Thoughts:  No  Memory:  WNL  Judgement:  Good  Insight:  Good  Psychomotor Activity:  Normal  Concentration:  Concentration: Good  Recall:  Good  Fund of Knowledge: Good  Language: Good  Assets:  Desire for Improvement  ADL's:  Intact  Cognition: WNL  Prognosis:  Good    DIAGNOSES:    ICD-10-CM  1. Insomnia due to other mental disorder  F51.05 hydrOXYzine  (ATARAX ) 50 MG tablet   F99 traZODone  (DESYREL ) 100 MG tablet    2. Generalized anxiety disorder  F41.1 hydrOXYzine  (ATARAX ) 50 MG tablet    traZODone  (DESYREL ) 100 MG tablet    buPROPion  (WELLBUTRIN  XL) 300 MG 24 hr tablet    sertraline  (ZOLOFT ) 100 MG tablet    3. Attention deficit hyperactivity disorder (ADHD), predominantly inattentive type  F90.0 buPROPion  (WELLBUTRIN  XL) 300 MG 24 hr tablet    sertraline  (ZOLOFT ) 100  MG tablet    4. Recurrent major depressive disorder, in partial remission (HCC)  F33.41 buPROPion  (WELLBUTRIN  XL) 300 MG 24 hr tablet    sertraline  (ZOLOFT ) 100 MG tablet      Receiving Psychotherapy: No    RECOMMENDATIONS:    Greater than 50% of  30 min face to face time with patient was spent on counseling and coordination of care. We discussed her good stability with anxiety and depression. She does a good job of practicing healthy coping mechanisms . She scratches her lower arms and hands a lot during interview.  She wears protective sleeves to prevent more scratching and damage to skin.   She does not want to change any of her current psychiatric medication. Reviewed medication list with her again today.   She will be following up for her skin in [place] in November. She is on cancellation list.   Very uncomfortable and I believe it is effect HR and BP.   We agreed to:   Continue Zoloft  to 150 mg at bedtime Continue Wellbutrin  300 mg XL in the am Continue Trazodone  to 150 mg daily at bedtime Increase Hydroxyzine  50 mg twice daily   Will report any side effects or worsening symptoms To follow up in 3 months to reassess. Provided emergency contact information. Reviewed PDMP  Redell DELENA Pizza, NP

## 2024-03-21 ENCOUNTER — Other Ambulatory Visit: Payer: Self-pay | Admitting: Family Medicine

## 2024-03-21 DIAGNOSIS — R531 Weakness: Secondary | ICD-10-CM

## 2024-04-05 ENCOUNTER — Ambulatory Visit
Admission: RE | Admit: 2024-04-05 | Discharge: 2024-04-05 | Disposition: A | Discharge status: Home / Self Care | Source: Ambulatory Visit | Attending: Family Medicine | Admitting: Family Medicine

## 2024-04-05 DIAGNOSIS — R531 Weakness: Secondary | ICD-10-CM

## 2024-04-30 ENCOUNTER — Encounter: Payer: Self-pay | Admitting: Behavioral Health

## 2024-04-30 ENCOUNTER — Ambulatory Visit: Admitting: Behavioral Health

## 2024-04-30 VITALS — Ht 67.0 in | Wt 132.0 lb

## 2024-04-30 DIAGNOSIS — F0631 Mood disorder due to known physiological condition with depressive features: Secondary | ICD-10-CM | POA: Diagnosis not present

## 2024-04-30 DIAGNOSIS — F411 Generalized anxiety disorder: Secondary | ICD-10-CM

## 2024-04-30 DIAGNOSIS — F99 Mental disorder, not otherwise specified: Secondary | ICD-10-CM

## 2024-04-30 DIAGNOSIS — F900 Attention-deficit hyperactivity disorder, predominantly inattentive type: Secondary | ICD-10-CM

## 2024-04-30 DIAGNOSIS — F3341 Major depressive disorder, recurrent, in partial remission: Secondary | ICD-10-CM

## 2024-04-30 DIAGNOSIS — F5105 Insomnia due to other mental disorder: Secondary | ICD-10-CM

## 2024-04-30 MED ORDER — HYDROXYZINE HCL 50 MG PO TABS: 50.0000 mg | ORAL_TABLET | Freq: Two times a day (BID) | ORAL | 1 refills | Status: DC | PRN

## 2024-04-30 MED ORDER — TRAZODONE HCL 100 MG PO TABS: 100.0000 mg | ORAL_TABLET | Freq: Every day | ORAL | 1 refills | Status: DC

## 2024-04-30 MED ORDER — SERTRALINE HCL 100 MG PO TABS: 150.0000 mg | ORAL_TABLET | Freq: Every day | ORAL | 1 refills | Status: DC

## 2024-04-30 MED ORDER — BUPROPION HCL ER (XL) 300 MG PO TB24: 300.0000 mg | ORAL_TABLET | Freq: Every day | ORAL | 1 refills | Status: DC

## 2024-04-30 NOTE — Progress Notes (Signed)
 [facility]  Patient ID: Olivia Wu,  MRN: [id number]  PCP: Katina Pfeiffer, PA-C  Date of Evaluation: 04/30/2024 Time spent:30 minutes  Chief Complaint:  Chief Complaint   Anxiety; Depression; Follow-up; Medication Refill; Patient Education     HISTORY/CURRENT STATUS: HPI  23  year old female presents to this office for follow up and medication management. No changes this visit.  Eczema flair has subsided some. Mostly severe on arms. Reporting low level depression today. She is looking much better with disease slowing. Her face is mostly clear today.   She says her anxiety is 3/10 and depression is 1/10 currently. Sleeping 7 hours or less per night due to itching.  She would like to get refills and commits to regular follow ups. Denies mania, no psychosis. No SI/HI.  Individual Medical History/ Review of Systems: Changes? :No   Allergies: Augmentin [amoxicillin-pot clavulanate], Bactrim [sulfamethoxazole-trimethoprim], Cleocin [clindamycin hcl], Codeine, Keflex [cephalexin], and Sulfa antibiotics  Current Medications:  Current Outpatient Medications:    AUVI-Q  0.3 MG/0.3ML SOAJ injection, Use as directed for life-threatening allergic reaction., Disp: 1 each, Rfl: 3   BLISOVI 24 FE 1-20 MG-MCG(24) tablet, , Disp: , Rfl:    buPROPion  (WELLBUTRIN  XL) 300 MG 24 hr tablet, Take 1 tablet (300 mg total) by mouth daily., Disp: 90 tablet, Rfl: 1   cetirizine (ZYRTEC) 10 MG tablet, Take 10 mg by mouth daily. Take 1 by mouth every night at bedtime., Disp: , Rfl:    famotidine (PEPCID) 20 MG tablet, Take 20 mg by mouth 2 (two) times daily., Disp: , Rfl:    hydrOXYzine  (ATARAX ) 50 MG tablet, Take 1 tablet (50 mg total) by mouth 2 (two) times daily as needed., Disp: 180 tablet, Rfl: 1   mometasone  (ELOCON ) 0.1 % ointment, Apply sparingly to red, itchy areas once or twice daily. (Patient not taking: Reported on 04/30/2023), Disp: 90 g, Rfl: 5   sertraline  (ZOLOFT ) 100 MG tablet,  Take 1.5 tablets (150 mg total) by mouth daily., Disp: 135 tablet, Rfl: 1   traZODone  (DESYREL ) 100 MG tablet, Take 1 tablet (100 mg total) by mouth at bedtime., Disp: 90 tablet, Rfl: 1   traZODone  (DESYREL ) 100 MG tablet, Take 1 tablet (100 mg total) by mouth at bedtime., Disp: 90 tablet, Rfl: 1   traZODone  (DESYREL ) 50 MG tablet, Take 1 tablet (50 mg total) by mouth at bedtime. (Patient not taking: Reported on 04/30/2023), Disp: 30 tablet, Rfl: 3 Medication Side Effects: none  Family Medical/ Social History: Changes? No  MENTAL HEALTH EXAM:  Height 5' 7 (1.702 m), weight 132 lb (59.9 kg).Body mass index is 20.67 kg/m.  General Appearance: Casual  Eye Contact:  Good  Speech:  Clear and Coherent  Volume:  Normal  Mood:  Anxious, Depressed, and Dysphoric  Affect:  Appropriate  Thought Process:  Coherent  Orientation:  Full (Time, Place, and Person)  Thought Content: Logical   Suicidal Thoughts:  No  Homicidal Thoughts:  No  Memory:  WNL  Judgement:  Good  Insight:  Good  Psychomotor Activity:  Normal  Concentration:  Concentration: Good  Recall:  Good  Fund of Knowledge: Good  Language: Good  Assets:  Desire for Improvement  ADL's:  Intact  Cognition: WNL  Prognosis:  Good    DIAGNOSES:    ICD-10-CM   1. Recurrent major depressive disorder, in partial remission  F33.41 buPROPion  (WELLBUTRIN  XL) 300 MG 24 hr tablet    sertraline  (ZOLOFT ) 100 MG tablet  2. Depression due to physical illness  F06.31     3. Generalized anxiety disorder  F41.1 hydrOXYzine  (ATARAX ) 50 MG tablet    buPROPion  (WELLBUTRIN  XL) 300 MG 24 hr tablet    sertraline  (ZOLOFT ) 100 MG tablet    4. Attention deficit hyperactivity disorder (ADHD), predominantly inattentive type  F90.0 buPROPion  (WELLBUTRIN  XL) 300 MG 24 hr tablet    sertraline  (ZOLOFT ) 100 MG tablet    5. Insomnia due to other mental disorder  F51.05 hydrOXYzine  (ATARAX ) 50 MG tablet   F99       Receiving Psychotherapy: No     RECOMMENDATIONS:  Greater than 50% of  30 min face to face time with patient was spent on counseling and coordination of care. We discussed her good stability with anxiety and depression. She does a good job of practicing healthy coping mechanisms . She scratches her lower arms and hands a lot during interview.  She wears protective sleeves to prevent more scratching and damage to skin.   She does not want to change any of her current psychiatric medication. Reviewed medication list with her again today.      We agreed to:   Continue Zoloft  to 150 mg at bedtime Continue Wellbutrin  300 mg XL in the am Continue Trazodone  to 150 mg daily at bedtime Continue Hydroxyzine  50 mg twice daily   Will report any side effects or worsening symptoms To follow up in 3 months to reassess. Provided emergency contact information. Reviewed PDMP     Redell DELENA Pizza, NP

## 2024-07-31 ENCOUNTER — Encounter: Payer: Self-pay | Admitting: Behavioral Health

## 2024-07-31 ENCOUNTER — Ambulatory Visit: Admitting: Behavioral Health

## 2024-07-31 DIAGNOSIS — F411 Generalized anxiety disorder: Secondary | ICD-10-CM

## 2024-07-31 DIAGNOSIS — F900 Attention-deficit hyperactivity disorder, predominantly inattentive type: Secondary | ICD-10-CM

## 2024-07-31 DIAGNOSIS — F5105 Insomnia due to other mental disorder: Secondary | ICD-10-CM

## 2024-07-31 DIAGNOSIS — F3341 Major depressive disorder, recurrent, in partial remission: Secondary | ICD-10-CM

## 2024-07-31 MED ORDER — SERTRALINE HCL 100 MG PO TABS: 100.0000 mg | ORAL_TABLET | Freq: Every day | ORAL | 1 refills | Status: AC

## 2024-07-31 MED ORDER — TRAZODONE HCL 100 MG PO TABS: 100.0000 mg | ORAL_TABLET | Freq: Every day | ORAL | 1 refills | Status: AC

## 2024-07-31 MED ORDER — HYDROXYZINE HCL 50 MG PO TABS: 50.0000 mg | ORAL_TABLET | Freq: Two times a day (BID) | ORAL | 1 refills | Status: AC | PRN

## 2024-07-31 MED ORDER — BUPROPION HCL ER (XL) 300 MG PO TB24: 300.0000 mg | ORAL_TABLET | Freq: Every day | ORAL | 1 refills | Status: AC

## 2024-07-31 NOTE — Progress Notes (Signed)
 "     [facility]  Patient ID: Olivia Wu,  MRN: [id number]  PCP: Katina Pfeiffer, PA-C  Date of Evaluation: 07/31/2024 Time spent:30 minutes  Chief Complaint:   HISTORY/CURRENT STATUS: HPI  Olivia Wu, 24  year old female presents to this office for follow up and medication management. No changes this visit.  Eczema flair has subsided some. Mostly severe on arms. Reporting low level depression today. She is looking much better with disease slowing. Her face is mostly clear today. Is looking thin. Says she is only getting about 1000 cal per day. Not eating breakfast or lunch. Eats dinner.   She says her anxiety is 3/10 and depression is 1/10 currently. Sleeping 7 hours or less per night due to itching.  She would like to get refills and commits to regular follow ups. Denies mania, no psychosis. No SI/HI  Individual Medical History/ Review of Systems: Changes? :No   Allergies: Augmentin [amoxicillin-pot clavulanate], Bactrim [sulfamethoxazole-trimethoprim], Cleocin [clindamycin hcl], Codeine, Keflex [cephalexin], and Sulfa antibiotics  Current Medications: Current Medications[1] Medication Side Effects: none  Family Medical/ Social History: Changes? No  MENTAL HEALTH EXAM:  Blood pressure 118/76, pulse 89, height 5' 7 (1.702 m), weight 140 lb (63.5 kg).Body mass index is 21.93 kg/m.  General Appearance: Casual, Neat, and Well Groomed  Eye Contact:  NA  Speech:  Clear and Coherent  Volume:  Normal  Mood:  NA  Affect:  Appropriate  Thought Process:  Coherent  Orientation:  Full (Time, Place, and Person)  Thought Content: Logical   Suicidal Thoughts:  No  Homicidal Thoughts:  No  Memory:  WNL  Judgement:  Good  Insight:  Good  Psychomotor Activity:  Normal  Concentration:  Concentration: Good  Recall:  Good  Fund of Knowledge: Good  Language: Good  Assets:  Desire for Improvement  ADL's:  Intact  Cognition: WNL  Prognosis:  Good    DIAGNOSES:    ICD-10-CM   1.  Generalized anxiety disorder  F41.1 hydrOXYzine  (ATARAX ) 50 MG tablet    traZODone  (DESYREL ) 100 MG tablet    buPROPion  (WELLBUTRIN  XL) 300 MG 24 hr tablet    sertraline  (ZOLOFT ) 100 MG tablet    2. Insomnia due to other mental disorder  F51.05 hydrOXYzine  (ATARAX ) 50 MG tablet   F99 traZODone  (DESYREL ) 100 MG tablet    3. Attention deficit hyperactivity disorder (ADHD), predominantly inattentive type  F90.0 buPROPion  (WELLBUTRIN  XL) 300 MG 24 hr tablet    sertraline  (ZOLOFT ) 100 MG tablet    4. Recurrent major depressive disorder, in partial remission  F33.41 buPROPion  (WELLBUTRIN  XL) 300 MG 24 hr tablet    sertraline  (ZOLOFT ) 100 MG tablet      Receiving Psychotherapy: No    RECOMMENDATIONS:   Greater than 50% of  30 min face to face time with patient was spent on counseling and coordination of care. We discussed her good stability with anxiety and depression. She does a good job of practicing healthy coping mechanisms . She scratches her lower arms and hands a lot during interview.  She wears protective sleeves to prevent more scratching and damage to skin.   She does not want to change any of her current psychiatric medication. Reviewed medication list with her again today.  Counseled patient on getting enough nutrients with her diet.  She is only getting about 1000 cal/day.  She does look a little frail.  Encouraged her to supplement with protein shakes nondairy due to her allergies.  Will we agreed to:   Continue Zoloft  to 100 mg at bedtime Continue Wellbutrin  300 mg XL in the am Continue Trazodone  to 100 mg daily at bedtime Continue Hydroxyzine  50 mg twice daily   Will report any side effects or worsening symptoms To follow up in 6 months to reassess. Provided emergency contact information. Reviewed PDMP  Redell DELENA Pizza, NP     [1]  Current Outpatient Medications:    AUVI-Q  0.3 MG/0.3ML SOAJ injection, Use as directed for life-threatening allergic reaction., Disp: 1  each, Rfl: 3   BLISOVI 24 FE 1-20 MG-MCG(24) tablet, , Disp: , Rfl:    buPROPion  (WELLBUTRIN  XL) 300 MG 24 hr tablet, Take 1 tablet (300 mg total) by mouth daily., Disp: 90 tablet, Rfl: 1   cetirizine (ZYRTEC) 10 MG tablet, Take 10 mg by mouth daily. Take 1 by mouth every night at bedtime., Disp: , Rfl:    famotidine (PEPCID) 20 MG tablet, Take 20 mg by mouth 2 (two) times daily., Disp: , Rfl:    hydrOXYzine  (ATARAX ) 50 MG tablet, Take 1 tablet (50 mg total) by mouth 2 (two) times daily as needed., Disp: 180 tablet, Rfl: 1   mometasone  (ELOCON ) 0.1 % ointment, Apply sparingly to red, itchy areas once or twice daily. (Patient not taking: Reported on 04/30/2023), Disp: 90 g, Rfl: 5   sertraline  (ZOLOFT ) 100 MG tablet, Take 1 tablet (100 mg total) by mouth daily., Disp: 90 tablet, Rfl: 1   traZODone  (DESYREL ) 100 MG tablet, Take 1 tablet (100 mg total) by mouth at bedtime., Disp: 90 tablet, Rfl: 1  "

## 2025-01-28 ENCOUNTER — Ambulatory Visit: Admitting: Behavioral Health
